# Patient Record
Sex: Female | Born: 1935 | Race: White | Hispanic: No | Smoking: Never smoker
Health system: Southern US, Community
[De-identification: ages and names within clinical notes are randomized; demographics above are authoritative.]

## PROBLEM LIST (undated history)

## (undated) DIAGNOSIS — E785 Hyperlipidemia, unspecified: Secondary | ICD-10-CM

## (undated) DIAGNOSIS — Z8673 Personal history of transient ischemic attack (TIA), and cerebral infarction without residual deficits: Secondary | ICD-10-CM

## (undated) DIAGNOSIS — I4891 Unspecified atrial fibrillation: Secondary | ICD-10-CM

## (undated) DIAGNOSIS — I739 Peripheral vascular disease, unspecified: Secondary | ICD-10-CM

## (undated) DIAGNOSIS — I639 Cerebral infarction, unspecified: Secondary | ICD-10-CM

## (undated) DIAGNOSIS — F0390 Unspecified dementia without behavioral disturbance: Secondary | ICD-10-CM

## (undated) DIAGNOSIS — S72009A Fracture of unspecified part of neck of unspecified femur, initial encounter for closed fracture: Secondary | ICD-10-CM

## (undated) DIAGNOSIS — C50919 Malignant neoplasm of unspecified site of unspecified female breast: Secondary | ICD-10-CM

## (undated) DIAGNOSIS — I10 Essential (primary) hypertension: Secondary | ICD-10-CM

## (undated) DIAGNOSIS — Z853 Personal history of malignant neoplasm of breast: Secondary | ICD-10-CM

## (undated) DIAGNOSIS — I509 Heart failure, unspecified: Secondary | ICD-10-CM

## (undated) HISTORY — DX: Essential (primary) hypertension: I10

## (undated) HISTORY — DX: Fracture of unspecified part of neck of unspecified femur, initial encounter for closed fracture: S72.009A

## (undated) HISTORY — PX: TONSILLECTOMY: SUR1361

## (undated) HISTORY — DX: Peripheral vascular disease, unspecified: I73.9

## (undated) HISTORY — PX: CYSTOSCOPY: SUR368

## (undated) HISTORY — DX: Unspecified atrial fibrillation: I48.91

## (undated) HISTORY — DX: Personal history of malignant neoplasm of breast: Z85.3

## (undated) HISTORY — PX: EYE SURGERY: SHX253

## (undated) HISTORY — DX: Hyperlipidemia, unspecified: E78.5

## (undated) HISTORY — PX: BREAST LUMPECTOMY: SHX2

## (undated) HISTORY — DX: Personal history of transient ischemic attack (TIA), and cerebral infarction without residual deficits: Z86.73

---

## 2000-04-22 ENCOUNTER — Encounter: Admission: RE | Admit: 2000-04-22 | Discharge: 2000-04-22 | Payer: Self-pay | Admitting: Family Medicine

## 2000-04-22 ENCOUNTER — Encounter: Payer: Self-pay | Admitting: Family Medicine

## 2001-05-24 ENCOUNTER — Encounter: Payer: Self-pay | Admitting: Family Medicine

## 2001-05-24 ENCOUNTER — Encounter: Admission: RE | Admit: 2001-05-24 | Discharge: 2001-05-24 | Payer: Self-pay | Admitting: Family Medicine

## 2002-06-06 ENCOUNTER — Encounter: Payer: Self-pay | Admitting: Family Medicine

## 2002-06-06 ENCOUNTER — Encounter: Admission: RE | Admit: 2002-06-06 | Discharge: 2002-06-06 | Payer: Self-pay | Admitting: Family Medicine

## 2003-06-08 ENCOUNTER — Encounter: Payer: Self-pay | Admitting: Family Medicine

## 2003-06-08 ENCOUNTER — Encounter: Admission: RE | Admit: 2003-06-08 | Discharge: 2003-06-08 | Payer: Self-pay | Admitting: Family Medicine

## 2003-09-05 ENCOUNTER — Other Ambulatory Visit: Admission: RE | Admit: 2003-09-05 | Discharge: 2003-09-05 | Payer: Self-pay | Admitting: Obstetrics and Gynecology

## 2004-07-10 ENCOUNTER — Encounter: Admission: RE | Admit: 2004-07-10 | Discharge: 2004-07-10 | Payer: Self-pay | Admitting: Family Medicine

## 2005-09-10 ENCOUNTER — Other Ambulatory Visit: Admission: RE | Admit: 2005-09-10 | Discharge: 2005-09-10 | Payer: Self-pay | Admitting: Obstetrics and Gynecology

## 2005-09-16 ENCOUNTER — Encounter: Admission: RE | Admit: 2005-09-16 | Discharge: 2005-09-16 | Payer: Self-pay | Admitting: Obstetrics and Gynecology

## 2006-04-28 ENCOUNTER — Encounter: Admission: RE | Admit: 2006-04-28 | Discharge: 2006-04-28 | Payer: Self-pay | Admitting: Surgery

## 2006-05-03 ENCOUNTER — Encounter (INDEPENDENT_AMBULATORY_CARE_PROVIDER_SITE_OTHER): Payer: Self-pay | Admitting: Surgery

## 2006-05-03 ENCOUNTER — Ambulatory Visit (HOSPITAL_BASED_OUTPATIENT_CLINIC_OR_DEPARTMENT_OTHER): Admission: RE | Admit: 2006-05-03 | Discharge: 2006-05-03 | Payer: Self-pay | Admitting: Surgery

## 2006-05-03 ENCOUNTER — Encounter (INDEPENDENT_AMBULATORY_CARE_PROVIDER_SITE_OTHER): Payer: Self-pay | Admitting: Specialist

## 2006-05-17 ENCOUNTER — Ambulatory Visit (HOSPITAL_COMMUNITY): Admission: RE | Admit: 2006-05-17 | Discharge: 2006-05-17 | Payer: Self-pay | Admitting: Surgery

## 2006-06-02 ENCOUNTER — Encounter (INDEPENDENT_AMBULATORY_CARE_PROVIDER_SITE_OTHER): Payer: Self-pay | Admitting: Surgery

## 2006-06-02 ENCOUNTER — Ambulatory Visit: Payer: Self-pay | Admitting: Oncology

## 2006-06-02 ENCOUNTER — Ambulatory Visit (HOSPITAL_BASED_OUTPATIENT_CLINIC_OR_DEPARTMENT_OTHER): Admission: RE | Admit: 2006-06-02 | Discharge: 2006-06-02 | Payer: Self-pay | Admitting: Surgery

## 2006-06-02 ENCOUNTER — Encounter (INDEPENDENT_AMBULATORY_CARE_PROVIDER_SITE_OTHER): Payer: Self-pay | Admitting: Specialist

## 2006-06-15 ENCOUNTER — Ambulatory Visit: Admission: RE | Admit: 2006-06-15 | Discharge: 2006-08-16 | Payer: Self-pay | Admitting: Radiation Oncology

## 2006-06-30 LAB — CBC WITH DIFFERENTIAL/PLATELET
Basophils Absolute: 0 10*3/uL (ref 0.0–0.1)
EOS%: 0.5 % (ref 0.0–7.0)
HCT: 35.1 % (ref 34.8–46.6)
HGB: 12 g/dL (ref 11.6–15.9)
MCH: 31 pg (ref 26.0–34.0)
MONO#: 0.6 10*3/uL (ref 0.1–0.9)
NEUT%: 65.2 % (ref 39.6–76.8)
lymph#: 1.1 10*3/uL (ref 0.9–3.3)

## 2006-06-30 LAB — COMPREHENSIVE METABOLIC PANEL
BUN: 24 mg/dL — ABNORMAL HIGH (ref 6–23)
CO2: 30 mEq/L (ref 19–32)
Calcium: 9.3 mg/dL (ref 8.4–10.5)
Chloride: 104 mEq/L (ref 96–112)
Creatinine, Ser: 1.03 mg/dL (ref 0.40–1.20)

## 2006-06-30 LAB — CANCER ANTIGEN 27.29: CA 27.29: 36 U/mL (ref 0–39)

## 2006-06-30 LAB — LACTATE DEHYDROGENASE: LDH: 150 U/L (ref 94–250)

## 2006-11-23 DIAGNOSIS — Z8673 Personal history of transient ischemic attack (TIA), and cerebral infarction without residual deficits: Secondary | ICD-10-CM

## 2006-11-23 DIAGNOSIS — I639 Cerebral infarction, unspecified: Secondary | ICD-10-CM

## 2006-11-23 HISTORY — DX: Personal history of transient ischemic attack (TIA), and cerebral infarction without residual deficits: Z86.73

## 2006-11-23 HISTORY — DX: Cerebral infarction, unspecified: I63.9

## 2007-01-28 ENCOUNTER — Encounter: Admission: RE | Admit: 2007-01-28 | Discharge: 2007-01-28 | Payer: Self-pay | Admitting: Obstetrics and Gynecology

## 2007-07-12 ENCOUNTER — Ambulatory Visit: Payer: Self-pay | Admitting: Oncology

## 2007-07-13 LAB — COMPREHENSIVE METABOLIC PANEL
ALT: 9 U/L (ref 0–35)
Albumin: 4.1 g/dL (ref 3.5–5.2)
Alkaline Phosphatase: 52 U/L (ref 39–117)
CO2: 30 mEq/L (ref 19–32)
Glucose, Bld: 128 mg/dL — ABNORMAL HIGH (ref 70–99)
Potassium: 4.1 mEq/L (ref 3.5–5.3)
Sodium: 141 mEq/L (ref 135–145)
Total Protein: 6.5 g/dL (ref 6.0–8.3)

## 2007-07-13 LAB — CBC WITH DIFFERENTIAL/PLATELET
Eosinophils Absolute: 0 10*3/uL (ref 0.0–0.5)
MONO#: 0.4 10*3/uL (ref 0.1–0.9)
NEUT#: 3.4 10*3/uL (ref 1.5–6.5)
RBC: 3.67 10*6/uL — ABNORMAL LOW (ref 3.70–5.32)
RDW: 13.1 % (ref 11.3–14.5)
WBC: 4.8 10*3/uL (ref 3.9–10.0)

## 2007-08-26 ENCOUNTER — Emergency Department (HOSPITAL_COMMUNITY): Admission: EM | Admit: 2007-08-26 | Discharge: 2007-08-26 | Payer: Self-pay | Admitting: *Deleted

## 2007-09-06 ENCOUNTER — Ambulatory Visit: Payer: Self-pay | Admitting: Cardiology

## 2007-09-13 ENCOUNTER — Ambulatory Visit: Payer: Self-pay

## 2007-10-05 ENCOUNTER — Ambulatory Visit: Payer: Self-pay | Admitting: Cardiology

## 2007-10-10 ENCOUNTER — Ambulatory Visit: Payer: Self-pay | Admitting: Cardiology

## 2007-11-07 ENCOUNTER — Ambulatory Visit: Payer: Self-pay | Admitting: Internal Medicine

## 2007-11-09 ENCOUNTER — Encounter: Payer: Self-pay | Admitting: Internal Medicine

## 2007-11-10 ENCOUNTER — Inpatient Hospital Stay (HOSPITAL_COMMUNITY): Admission: EM | Admit: 2007-11-10 | Discharge: 2007-11-10 | Payer: Self-pay | Admitting: Emergency Medicine

## 2007-11-10 ENCOUNTER — Encounter: Payer: Self-pay | Admitting: Family Medicine

## 2007-11-10 ENCOUNTER — Ambulatory Visit: Payer: Self-pay | Admitting: Surgery

## 2007-11-10 ENCOUNTER — Ambulatory Visit: Payer: Self-pay | Admitting: Family Medicine

## 2007-11-24 DIAGNOSIS — S72009A Fracture of unspecified part of neck of unspecified femur, initial encounter for closed fracture: Secondary | ICD-10-CM

## 2007-11-24 HISTORY — PX: OTHER SURGICAL HISTORY: SHX169

## 2007-11-24 HISTORY — DX: Fracture of unspecified part of neck of unspecified femur, initial encounter for closed fracture: S72.009A

## 2007-11-28 ENCOUNTER — Telehealth (INDEPENDENT_AMBULATORY_CARE_PROVIDER_SITE_OTHER): Payer: Self-pay | Admitting: *Deleted

## 2007-11-28 ENCOUNTER — Ambulatory Visit: Payer: Self-pay | Admitting: Cardiology

## 2007-11-30 ENCOUNTER — Ambulatory Visit: Payer: Self-pay

## 2007-11-30 ENCOUNTER — Ambulatory Visit: Payer: Self-pay | Admitting: Cardiovascular Disease

## 2007-12-05 ENCOUNTER — Ambulatory Visit: Payer: Self-pay | Admitting: Internal Medicine

## 2007-12-05 DIAGNOSIS — I10 Essential (primary) hypertension: Secondary | ICD-10-CM | POA: Insufficient documentation

## 2007-12-05 DIAGNOSIS — Z853 Personal history of malignant neoplasm of breast: Secondary | ICD-10-CM

## 2007-12-05 DIAGNOSIS — I739 Peripheral vascular disease, unspecified: Secondary | ICD-10-CM

## 2007-12-05 DIAGNOSIS — M810 Age-related osteoporosis without current pathological fracture: Secondary | ICD-10-CM | POA: Insufficient documentation

## 2007-12-05 DIAGNOSIS — R300 Dysuria: Secondary | ICD-10-CM

## 2007-12-05 DIAGNOSIS — Z8679 Personal history of other diseases of the circulatory system: Secondary | ICD-10-CM | POA: Insufficient documentation

## 2007-12-05 DIAGNOSIS — R32 Unspecified urinary incontinence: Secondary | ICD-10-CM

## 2007-12-05 DIAGNOSIS — E785 Hyperlipidemia, unspecified: Secondary | ICD-10-CM

## 2007-12-05 LAB — CONVERTED CEMR LAB

## 2007-12-06 LAB — CONVERTED CEMR LAB
Bilirubin Urine: NEGATIVE
Crystals: NEGATIVE
Hemoglobin, Urine: NEGATIVE
Ketones, ur: NEGATIVE mg/dL
Nitrite: NEGATIVE
Urine Glucose: NEGATIVE mg/dL

## 2007-12-21 ENCOUNTER — Ambulatory Visit: Payer: Self-pay | Admitting: Internal Medicine

## 2007-12-21 DIAGNOSIS — I6350 Cerebral infarction due to unspecified occlusion or stenosis of unspecified cerebral artery: Secondary | ICD-10-CM | POA: Insufficient documentation

## 2007-12-21 LAB — CONVERTED CEMR LAB
ALT: 16 units/L (ref 0–35)
AST: 28 units/L (ref 0–37)
Albumin: 3.6 g/dL (ref 3.5–5.2)
BUN: 21 mg/dL (ref 6–23)
CO2: 33 meq/L — ABNORMAL HIGH (ref 19–32)
Calcium: 9.2 mg/dL (ref 8.4–10.5)
Cholesterol: 195 mg/dL (ref 0–200)
Creatinine, Ser: 1.1 mg/dL (ref 0.4–1.2)
LDL Cholesterol: 117 mg/dL — ABNORMAL HIGH (ref 0–99)
TSH: 3.31 microintl units/mL (ref 0.35–5.50)
Total CHOL/HDL Ratio: 3.1
VLDL: 16 mg/dL (ref 0–40)

## 2007-12-22 ENCOUNTER — Telehealth: Payer: Self-pay | Admitting: Internal Medicine

## 2007-12-23 ENCOUNTER — Ambulatory Visit: Payer: Self-pay | Admitting: Internal Medicine

## 2007-12-27 ENCOUNTER — Encounter: Payer: Self-pay | Admitting: Internal Medicine

## 2007-12-27 LAB — CONVERTED CEMR LAB
Bacteria, UA: NEGATIVE
Bilirubin Urine: NEGATIVE
Crystals: NEGATIVE
Ketones, ur: NEGATIVE mg/dL
Mucus, UA: NEGATIVE
Nitrite: NEGATIVE
Urine Glucose: NEGATIVE mg/dL

## 2008-01-02 ENCOUNTER — Ambulatory Visit: Payer: Self-pay | Admitting: Cardiology

## 2008-01-12 ENCOUNTER — Ambulatory Visit: Payer: Self-pay | Admitting: Cardiovascular Disease

## 2008-01-20 ENCOUNTER — Ambulatory Visit: Payer: Self-pay | Admitting: Internal Medicine

## 2008-01-24 ENCOUNTER — Encounter: Payer: Self-pay | Admitting: Internal Medicine

## 2008-01-24 ENCOUNTER — Telehealth: Payer: Self-pay | Admitting: Internal Medicine

## 2008-01-26 ENCOUNTER — Ambulatory Visit: Payer: Self-pay | Admitting: Internal Medicine

## 2008-01-26 DIAGNOSIS — R609 Edema, unspecified: Secondary | ICD-10-CM

## 2008-02-07 ENCOUNTER — Telehealth: Payer: Self-pay | Admitting: Internal Medicine

## 2008-02-20 ENCOUNTER — Telehealth: Payer: Self-pay | Admitting: Internal Medicine

## 2008-02-20 ENCOUNTER — Ambulatory Visit: Payer: Self-pay | Admitting: Internal Medicine

## 2008-02-20 LAB — CONVERTED CEMR LAB
BUN: 26 mg/dL — ABNORMAL HIGH (ref 6–23)
CO2: 33 meq/L — ABNORMAL HIGH (ref 19–32)
Calcium: 9.1 mg/dL (ref 8.4–10.5)
Creatinine, Ser: 1.1 mg/dL (ref 0.4–1.2)
GFR calc Af Amer: 61 mL/min
Glucose, Bld: 115 mg/dL — ABNORMAL HIGH (ref 70–99)
HDL: 55.4 mg/dL (ref >39.0)
Sodium: 143 meq/L (ref 135–145)
VLDL: 10 mg/dL (ref 0–40)

## 2008-02-23 ENCOUNTER — Encounter: Admission: RE | Admit: 2008-02-23 | Discharge: 2008-02-23 | Payer: Self-pay | Admitting: Internal Medicine

## 2008-02-24 ENCOUNTER — Encounter: Payer: Self-pay | Admitting: Internal Medicine

## 2008-02-27 ENCOUNTER — Ambulatory Visit: Payer: Self-pay | Admitting: Internal Medicine

## 2008-02-27 DIAGNOSIS — R7309 Other abnormal glucose: Secondary | ICD-10-CM | POA: Insufficient documentation

## 2008-02-28 ENCOUNTER — Telehealth (INDEPENDENT_AMBULATORY_CARE_PROVIDER_SITE_OTHER): Payer: Self-pay | Admitting: *Deleted

## 2008-02-29 ENCOUNTER — Encounter: Admission: RE | Admit: 2008-02-29 | Discharge: 2008-02-29 | Payer: Self-pay | Admitting: Oncology

## 2008-03-07 ENCOUNTER — Ambulatory Visit: Payer: Self-pay | Admitting: Cardiology

## 2008-03-15 ENCOUNTER — Encounter: Payer: Self-pay | Admitting: Internal Medicine

## 2008-05-08 ENCOUNTER — Encounter: Payer: Self-pay | Admitting: Internal Medicine

## 2008-05-15 ENCOUNTER — Telehealth: Payer: Self-pay | Admitting: Internal Medicine

## 2008-05-21 ENCOUNTER — Encounter: Payer: Self-pay | Admitting: Internal Medicine

## 2008-06-04 ENCOUNTER — Ambulatory Visit: Payer: Self-pay | Admitting: Internal Medicine

## 2008-06-06 ENCOUNTER — Ambulatory Visit: Payer: Self-pay | Admitting: Internal Medicine

## 2008-07-04 ENCOUNTER — Encounter: Payer: Self-pay | Admitting: Internal Medicine

## 2008-07-05 ENCOUNTER — Ambulatory Visit: Payer: Self-pay | Admitting: Internal Medicine

## 2008-07-05 DIAGNOSIS — R1310 Dysphagia, unspecified: Secondary | ICD-10-CM | POA: Insufficient documentation

## 2008-07-05 LAB — CONVERTED CEMR LAB
BUN: 23 mg/dL (ref 6–23)
CO2: 33 meq/L — ABNORMAL HIGH (ref 19–32)
Calcium: 9 mg/dL (ref 8.4–10.5)
Chloride: 104 meq/L (ref 96–112)
Creatinine, Ser: 1.1 mg/dL (ref 0.4–1.2)
Glucose, Bld: 117 mg/dL — ABNORMAL HIGH (ref 70–99)
Potassium: 4.4 meq/L (ref 3.5–5.1)
Sodium: 143 meq/L (ref 135–145)

## 2008-07-06 ENCOUNTER — Ambulatory Visit: Payer: Self-pay | Admitting: Cardiovascular Disease

## 2008-07-06 ENCOUNTER — Encounter: Payer: Self-pay | Admitting: Internal Medicine

## 2008-07-10 ENCOUNTER — Telehealth: Payer: Self-pay | Admitting: Internal Medicine

## 2008-07-17 ENCOUNTER — Ambulatory Visit: Payer: Self-pay | Admitting: Oncology

## 2008-07-19 ENCOUNTER — Ambulatory Visit: Payer: Self-pay | Admitting: Internal Medicine

## 2008-07-19 DIAGNOSIS — R4182 Altered mental status, unspecified: Secondary | ICD-10-CM | POA: Insufficient documentation

## 2008-07-19 LAB — COMPREHENSIVE METABOLIC PANEL WITH GFR
ALT: 19 U/L (ref 0–35)
AST: 31 U/L (ref 0–37)
Albumin: 3.6 g/dL (ref 3.5–5.2)
Alkaline Phosphatase: 29 U/L — ABNORMAL LOW (ref 39–117)
BUN: 16 mg/dL (ref 6–23)
CO2: 29 meq/L (ref 19–32)
Calcium: 8.9 mg/dL (ref 8.4–10.5)
Chloride: 105 meq/L (ref 96–112)
Creatinine, Ser: 1.16 mg/dL (ref 0.40–1.20)
Glucose, Bld: 110 mg/dL — ABNORMAL HIGH (ref 70–99)
Potassium: 3.8 meq/L (ref 3.5–5.3)
Sodium: 141 meq/L (ref 135–145)
Total Bilirubin: 0.7 mg/dL (ref 0.3–1.2)
Total Protein: 6.3 g/dL (ref 6.0–8.3)

## 2008-07-19 LAB — CBC WITH DIFFERENTIAL/PLATELET
Basophils Absolute: 0 10*3/uL (ref 0.0–0.1)
EOS%: 1.5 % (ref 0.0–7.0)
HCT: 35.1 % (ref 34.8–46.6)
HGB: 12 g/dL (ref 11.6–15.9)
LYMPH%: 24.3 % (ref 14.0–48.0)
MCH: 31.6 pg (ref 26.0–34.0)
MCV: 92.1 fL (ref 81.0–101.0)
MONO%: 10.5 % (ref 0.0–13.0)
NEUT%: 63.2 % (ref 39.6–76.8)
Platelets: 187 10*3/uL (ref 145–400)

## 2008-07-24 HISTORY — PX: OTHER SURGICAL HISTORY: SHX169

## 2008-07-31 ENCOUNTER — Encounter: Payer: Self-pay | Admitting: Internal Medicine

## 2008-08-13 ENCOUNTER — Ambulatory Visit: Payer: Self-pay | Admitting: Internal Medicine

## 2008-08-13 ENCOUNTER — Telehealth: Payer: Self-pay | Admitting: Internal Medicine

## 2008-08-13 ENCOUNTER — Inpatient Hospital Stay (HOSPITAL_COMMUNITY): Admission: AD | Admit: 2008-08-13 | Discharge: 2008-08-17 | Payer: Self-pay | Admitting: Internal Medicine

## 2008-08-13 DIAGNOSIS — S7223XA Displaced subtrochanteric fracture of unspecified femur, initial encounter for closed fracture: Secondary | ICD-10-CM

## 2008-08-31 ENCOUNTER — Ambulatory Visit: Payer: Self-pay | Admitting: Internal Medicine

## 2008-10-03 ENCOUNTER — Encounter: Payer: Self-pay | Admitting: Internal Medicine

## 2008-10-08 ENCOUNTER — Ambulatory Visit: Payer: Self-pay | Admitting: Internal Medicine

## 2008-10-22 ENCOUNTER — Telehealth: Payer: Self-pay | Admitting: Internal Medicine

## 2008-11-01 ENCOUNTER — Ambulatory Visit: Payer: Self-pay | Admitting: Cardiology

## 2008-11-15 ENCOUNTER — Ambulatory Visit: Payer: Self-pay | Admitting: Cardiology

## 2008-11-15 LAB — CONVERTED CEMR LAB
BUN: 22 mg/dL (ref 6–23)
CO2: 31 meq/L (ref 19–32)
Calcium: 8.9 mg/dL (ref 8.4–10.5)
Creatinine, Ser: 1 mg/dL (ref 0.4–1.2)
GFR calc non Af Amer: 56 mL/min
Sodium: 140 meq/L (ref 135–145)

## 2008-12-12 ENCOUNTER — Ambulatory Visit: Payer: Self-pay | Admitting: Internal Medicine

## 2008-12-12 LAB — CONVERTED CEMR LAB
BUN: 18 mg/dL (ref 6–23)
CO2: 33 meq/L — ABNORMAL HIGH (ref 19–32)
Chloride: 107 meq/L (ref 96–112)
Cholesterol: 231 mg/dL (ref 0–200)
Creatinine, Ser: 1 mg/dL (ref 0.4–1.2)
Direct LDL: 151.4 mg/dL
Potassium: 4.7 meq/L (ref 3.5–5.1)
Triglycerides: 71 mg/dL (ref 0–149)

## 2009-01-28 ENCOUNTER — Encounter: Payer: Self-pay | Admitting: Cardiology

## 2009-01-28 ENCOUNTER — Ambulatory Visit: Payer: Self-pay | Admitting: Cardiology

## 2009-05-14 ENCOUNTER — Encounter: Admission: RE | Admit: 2009-05-14 | Discharge: 2009-05-14 | Payer: Self-pay | Admitting: Cardiology

## 2009-05-14 LAB — HM MAMMOGRAPHY

## 2009-05-18 ENCOUNTER — Emergency Department (HOSPITAL_COMMUNITY): Admission: EM | Admit: 2009-05-18 | Discharge: 2009-05-18 | Payer: Self-pay | Admitting: Emergency Medicine

## 2009-05-28 ENCOUNTER — Ambulatory Visit (HOSPITAL_BASED_OUTPATIENT_CLINIC_OR_DEPARTMENT_OTHER): Admission: RE | Admit: 2009-05-28 | Discharge: 2009-05-28 | Payer: Self-pay | Admitting: Urology

## 2009-06-01 ENCOUNTER — Telehealth (INDEPENDENT_AMBULATORY_CARE_PROVIDER_SITE_OTHER): Payer: Self-pay | Admitting: Physician Assistant

## 2009-06-03 ENCOUNTER — Ambulatory Visit: Payer: Self-pay | Admitting: Cardiology

## 2009-06-03 DIAGNOSIS — I4891 Unspecified atrial fibrillation: Secondary | ICD-10-CM

## 2009-06-06 ENCOUNTER — Ambulatory Visit: Payer: Self-pay | Admitting: Cardiology

## 2009-06-06 LAB — CONVERTED CEMR LAB: POC INR: 1.1

## 2009-06-13 ENCOUNTER — Ambulatory Visit: Payer: Self-pay | Admitting: Cardiovascular Disease

## 2009-06-13 LAB — CONVERTED CEMR LAB
POC INR: 1.9
Prothrombin Time: 16.8 s

## 2009-06-20 ENCOUNTER — Ambulatory Visit: Payer: Self-pay | Admitting: Cardiovascular Disease

## 2009-06-20 ENCOUNTER — Telehealth: Payer: Self-pay | Admitting: Cardiology

## 2009-06-20 LAB — CONVERTED CEMR LAB
POC INR: 3
Prothrombin Time: 20.8 s

## 2009-06-27 ENCOUNTER — Ambulatory Visit: Payer: Self-pay | Admitting: Cardiology

## 2009-06-27 LAB — CONVERTED CEMR LAB: POC INR: 1.5

## 2009-07-04 ENCOUNTER — Ambulatory Visit: Payer: Self-pay | Admitting: Internal Medicine

## 2009-07-04 LAB — CONVERTED CEMR LAB: POC INR: 2.3

## 2009-07-09 ENCOUNTER — Ambulatory Visit: Payer: Self-pay | Admitting: Internal Medicine

## 2009-07-11 ENCOUNTER — Ambulatory Visit: Payer: Self-pay | Admitting: Cardiology

## 2009-07-11 ENCOUNTER — Encounter (INDEPENDENT_AMBULATORY_CARE_PROVIDER_SITE_OTHER): Payer: Self-pay | Admitting: Cardiology

## 2009-07-11 LAB — CONVERTED CEMR LAB: POC INR: 2.3

## 2009-07-19 ENCOUNTER — Ambulatory Visit: Payer: Self-pay | Admitting: Cardiovascular Disease

## 2009-08-16 ENCOUNTER — Ambulatory Visit: Payer: Self-pay | Admitting: Internal Medicine

## 2009-08-16 LAB — CONVERTED CEMR LAB
Basophils Absolute: 0 10*3/uL (ref 0.0–0.1)
CO2: 25 meq/L (ref 19–32)
Chloride: 106 meq/L (ref 96–112)
Eosinophils Relative: 1.6 % (ref 0.0–5.0)
GFR calc non Af Amer: 55.66 mL/min (ref >60)
Monocytes Absolute: 0.7 10*3/uL (ref 0.1–1.0)
Monocytes Relative: 13.6 % — ABNORMAL HIGH (ref 3.0–12.0)
Neutrophils Relative %: 63 % (ref 43.0–77.0)
Phosphorus: 3.6 mg/dL (ref 2.3–4.6)
Platelets: 161 10*3/uL (ref 150.0–400.0)
Potassium: 4.4 meq/L (ref 3.5–5.1)
RDW: 12.3 % (ref 11.5–14.6)
Sodium: 136 meq/L (ref 135–145)
TSH: 2.22 microintl units/mL (ref 0.35–5.50)
Vitamin B-12: 557 pg/mL (ref 211–911)
WBC: 5.5 10*3/uL (ref 4.5–10.5)

## 2009-08-21 ENCOUNTER — Telehealth: Payer: Self-pay | Admitting: Internal Medicine

## 2009-08-21 ENCOUNTER — Ambulatory Visit: Payer: Self-pay | Admitting: Cardiovascular Disease

## 2009-08-21 LAB — CONVERTED CEMR LAB: POC INR: 3

## 2009-08-22 ENCOUNTER — Ambulatory Visit: Payer: Self-pay | Admitting: Internal Medicine

## 2009-09-05 ENCOUNTER — Ambulatory Visit: Payer: Self-pay | Admitting: Cardiology

## 2009-09-13 ENCOUNTER — Ambulatory Visit: Payer: Self-pay | Admitting: Oncology

## 2009-09-19 ENCOUNTER — Ambulatory Visit: Payer: Self-pay | Admitting: Cardiology

## 2009-09-19 LAB — CONVERTED CEMR LAB: POC INR: 2.7

## 2009-10-14 ENCOUNTER — Ambulatory Visit: Payer: Self-pay | Admitting: Internal Medicine

## 2009-11-11 ENCOUNTER — Ambulatory Visit: Payer: Self-pay | Admitting: Cardiology

## 2009-11-11 ENCOUNTER — Telehealth: Payer: Self-pay | Admitting: Internal Medicine

## 2009-11-11 LAB — CONVERTED CEMR LAB: POC INR: 3.5

## 2009-11-13 ENCOUNTER — Ambulatory Visit: Payer: Self-pay | Admitting: Internal Medicine

## 2009-11-13 DIAGNOSIS — R0989 Other specified symptoms and signs involving the circulatory and respiratory systems: Secondary | ICD-10-CM

## 2009-11-13 DIAGNOSIS — R0609 Other forms of dyspnea: Secondary | ICD-10-CM

## 2009-12-02 ENCOUNTER — Encounter: Payer: Self-pay | Admitting: Internal Medicine

## 2009-12-02 ENCOUNTER — Ambulatory Visit: Payer: Self-pay

## 2009-12-02 ENCOUNTER — Ambulatory Visit: Payer: Self-pay | Admitting: Cardiology

## 2009-12-02 ENCOUNTER — Ambulatory Visit (HOSPITAL_COMMUNITY): Admission: RE | Admit: 2009-12-02 | Discharge: 2009-12-02 | Payer: Self-pay | Admitting: Internal Medicine

## 2009-12-09 ENCOUNTER — Ambulatory Visit: Payer: Self-pay | Admitting: Cardiology

## 2009-12-09 LAB — CONVERTED CEMR LAB: POC INR: 2

## 2009-12-13 ENCOUNTER — Telehealth (INDEPENDENT_AMBULATORY_CARE_PROVIDER_SITE_OTHER): Payer: Self-pay | Admitting: *Deleted

## 2009-12-13 ENCOUNTER — Ambulatory Visit: Payer: Self-pay | Admitting: Internal Medicine

## 2009-12-13 DIAGNOSIS — R269 Unspecified abnormalities of gait and mobility: Secondary | ICD-10-CM | POA: Insufficient documentation

## 2009-12-14 ENCOUNTER — Encounter: Payer: Self-pay | Admitting: Internal Medicine

## 2009-12-16 ENCOUNTER — Telehealth: Payer: Self-pay | Admitting: Internal Medicine

## 2009-12-19 ENCOUNTER — Telehealth: Payer: Self-pay | Admitting: Internal Medicine

## 2009-12-24 ENCOUNTER — Encounter: Payer: Self-pay | Admitting: Internal Medicine

## 2009-12-24 ENCOUNTER — Encounter (INDEPENDENT_AMBULATORY_CARE_PROVIDER_SITE_OTHER): Payer: Self-pay | Admitting: *Deleted

## 2009-12-27 ENCOUNTER — Ambulatory Visit: Payer: Self-pay | Admitting: Oncology

## 2009-12-31 ENCOUNTER — Encounter: Payer: Self-pay | Admitting: Internal Medicine

## 2009-12-31 LAB — CBC WITH DIFFERENTIAL/PLATELET
BASO%: 0.3 % (ref 0.0–2.0)
Eosinophils Absolute: 0.1 10*3/uL (ref 0.0–0.5)
MCHC: 33.5 g/dL (ref 31.5–36.0)
MONO#: 0.6 10*3/uL (ref 0.1–0.9)
NEUT#: 4.9 10*3/uL (ref 1.5–6.5)
RBC: 4.26 10*6/uL (ref 3.70–5.45)
RDW: 13.6 % (ref 11.2–14.5)
WBC: 6.5 10*3/uL (ref 3.9–10.3)
lymph#: 0.9 10*3/uL (ref 0.9–3.3)

## 2010-01-06 ENCOUNTER — Encounter (INDEPENDENT_AMBULATORY_CARE_PROVIDER_SITE_OTHER): Payer: Self-pay | Admitting: Cardiology

## 2010-01-06 ENCOUNTER — Ambulatory Visit: Payer: Self-pay | Admitting: Cardiology

## 2010-01-16 ENCOUNTER — Ambulatory Visit: Payer: Self-pay | Admitting: Internal Medicine

## 2010-01-23 ENCOUNTER — Encounter: Payer: Self-pay | Admitting: Internal Medicine

## 2010-01-27 ENCOUNTER — Encounter: Admission: RE | Admit: 2010-01-27 | Discharge: 2010-01-27 | Payer: Self-pay | Admitting: Neurology

## 2010-02-03 ENCOUNTER — Ambulatory Visit: Payer: Self-pay | Admitting: Internal Medicine

## 2010-02-12 ENCOUNTER — Telehealth: Payer: Self-pay | Admitting: Internal Medicine

## 2010-02-17 ENCOUNTER — Ambulatory Visit: Payer: Self-pay | Admitting: Internal Medicine

## 2010-03-03 ENCOUNTER — Ambulatory Visit: Payer: Self-pay | Admitting: Cardiovascular Disease

## 2010-03-03 LAB — CONVERTED CEMR LAB: POC INR: 2

## 2010-03-31 ENCOUNTER — Ambulatory Visit: Payer: Self-pay | Admitting: Cardiology

## 2010-03-31 LAB — CONVERTED CEMR LAB: POC INR: 2.1

## 2010-04-23 ENCOUNTER — Ambulatory Visit: Payer: Self-pay | Admitting: Oncology

## 2010-04-28 ENCOUNTER — Ambulatory Visit: Payer: Self-pay | Admitting: Cardiology

## 2010-05-19 ENCOUNTER — Encounter: Admission: RE | Admit: 2010-05-19 | Discharge: 2010-05-19 | Payer: Self-pay | Admitting: Oncology

## 2010-05-27 ENCOUNTER — Ambulatory Visit: Payer: Self-pay | Admitting: Internal Medicine

## 2010-05-27 LAB — CONVERTED CEMR LAB: POC INR: 2.4

## 2010-06-04 ENCOUNTER — Encounter: Payer: Self-pay | Admitting: Cardiology

## 2010-06-16 ENCOUNTER — Encounter: Admission: RE | Admit: 2010-06-16 | Discharge: 2010-06-16 | Payer: Self-pay | Admitting: Oncology

## 2010-06-19 ENCOUNTER — Ambulatory Visit (HOSPITAL_BASED_OUTPATIENT_CLINIC_OR_DEPARTMENT_OTHER): Admission: RE | Admit: 2010-06-19 | Discharge: 2010-06-19 | Payer: Self-pay | Admitting: Urology

## 2010-07-22 ENCOUNTER — Ambulatory Visit: Payer: Self-pay | Admitting: Cardiovascular Disease

## 2010-07-22 LAB — CONVERTED CEMR LAB: POC INR: 2.6

## 2010-08-19 ENCOUNTER — Ambulatory Visit: Payer: Self-pay | Admitting: Cardiovascular Disease

## 2010-08-21 ENCOUNTER — Ambulatory Visit: Payer: Self-pay | Admitting: Internal Medicine

## 2010-09-16 ENCOUNTER — Ambulatory Visit: Payer: Self-pay | Admitting: Cardiology

## 2010-09-16 LAB — CONVERTED CEMR LAB: POC INR: 2.6

## 2010-09-18 ENCOUNTER — Encounter: Payer: Self-pay | Admitting: Cardiology

## 2010-09-19 ENCOUNTER — Ambulatory Visit: Payer: Self-pay | Admitting: Cardiology

## 2010-10-14 ENCOUNTER — Ambulatory Visit: Payer: Self-pay | Admitting: Cardiovascular Disease

## 2010-11-05 ENCOUNTER — Ambulatory Visit: Payer: Self-pay | Admitting: Cardiology

## 2010-11-05 LAB — CONVERTED CEMR LAB: POC INR: 3.6

## 2010-11-19 ENCOUNTER — Ambulatory Visit: Payer: Self-pay | Admitting: Cardiology

## 2010-12-03 ENCOUNTER — Ambulatory Visit: Admission: RE | Admit: 2010-12-03 | Discharge: 2010-12-03 | Payer: Self-pay | Source: Home / Self Care

## 2010-12-03 LAB — CONVERTED CEMR LAB: POC INR: 3.4

## 2010-12-14 ENCOUNTER — Encounter: Payer: Self-pay | Admitting: Obstetrics and Gynecology

## 2010-12-14 ENCOUNTER — Encounter: Payer: Self-pay | Admitting: Oncology

## 2010-12-17 ENCOUNTER — Ambulatory Visit: Admit: 2010-12-17 | Discharge: 2010-12-17 | Payer: Self-pay

## 2010-12-17 LAB — CONVERTED CEMR LAB
INR: 2.9
POC INR: 2.9

## 2010-12-23 NOTE — Assessment & Plan Note (Signed)
Summary: f/u appt (appt made by grandaughter) flu shot/cd   Vital Signs:  Patient profile:   77 year old female Height:      60 inches Weight:      117 pounds BMI:     22.93 O2 Sat:      88 % on Room air Temp:     98.0 degrees F oral Pulse rate:   95 / minute BP sitting:   100 / 64  (left arm) Cuff size:   regular  O2 Flow:  Room air CC: pt here for f/u. /cp sma   Primary Care Provider:  Jacques Navy MD  CC:  pt here for f/u. /cp sma.  History of Present Illness: Swelling of the left foot and ankle. Progressive during the day with some resolution over night. Elevating legs during the day. Not on any meds.   Still having a lot of issue of confusion per grand-daughter. Seems to wax and wane. Already on medication.  No cardiac symptoms. Good appetite. Not fearful or agitated.   Current Medications (verified): 1)  Fosamax 70 Mg  Tabs (Alendronate Sodium) .... Take One Tablet By Mouth Weekly 2)  Fish Oil 1000 Mg Caps (Omega-3 Fatty Acids) .... Once Daily 3)  Dulcolax Stool Softener 100 Mg Caps (Docusate Sodium) .... As Needed 4)  Vitamin C 500 Mg Tabs (Ascorbic Acid) .... Take 1 Tablet By Mouth Once A Day 5)  Glucosamine-Chondroitin-Msm 500-250-250 Mg Caps (Glucosamine-Chondroitin-Msm) .... Take 1 Tablet By Mouth Once A Day 6)  Tylenol Extra Strength 500 Mg Tabs (Acetaminophen) .... As Needed 7)  Centrum  Tabs (Multiple Vitamins-Minerals) .... Take 1 Tablet By Mouth Once A Day 8)  Metoprolol Tartrate 25 Mg Tabs (Metoprolol Tartrate) .Marland Kitchen.. 1 Tab As Directed By Physcian Twice A Day 9)  Warfarin Sodium 5 Mg Tabs (Warfarin Sodium) .... One By Mouth Daily or As Directed 10)  Caltrate 600+d 600-400 Mg-Unit Tabs (Calcium Carbonate-Vitamin D) .Marland Kitchen.. 1 Tablet Every Evening 11)  Namenda 10 Mg Tabs (Memantine Hcl) .Marland Kitchen.. 1 By Mouth Two Times A Day  Allergies (verified): No Known Drug Allergies  Past History:  Past Medical History: Last updated: 06/03/2009 Breast cancer, hx of-left,  s/p lumpectomy '08, also XRT Hyperlipidemia Hypertension Osteoporosis Cerebrovascular accident, hx of '08 Urinary incontinence- irritable bladder Peripheral vascular disease (most recent ABI 0.79 on the right and 0.53 on the left).  Fracture left hip Sept '09 Atrial fibrillation  Past Surgical History: Last updated: 06/03/2009 Tonsillectomy Left breast lumpectomy ORIF left hip Sept '09 G1P1 Cystoscopy with Botox injection  Family History: Last updated: 01/26/2009 Noncontributory for early coronary artery disease.  Mother died at age 78 of a myocardial infarction. Mother had breast cancer and CAD.  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  The patient denies anorexia, fever, weight loss, chest pain, dyspnea on exertion, hemoptysis, abdominal pain, muscle weakness, depression, and enlarged lymph nodes.    Physical Exam  General:  Pleaasant elderly white female in  no distress Head:  normocephalic and atraumatic.   Eyes:  PERRLA, C&S clear Neck:  supple.   Lungs:  normal respiratory effort and normal breath sounds.   Heart:  normal rate and regular rhythm.   Abdomen:  soft and normal bowel sounds.  Msk:  normal ROM, no joint swelling, no joint warmth, and no redness over joints.   Pulses:  2+ radial Extremities:  trace to 1-+ edema distal LLE. Neurologic:  cranial nerves II-XII intact and gait normal.   Skin:  turgor normal, color normal, and no suspicious lesions.   Cervical Nodes:  no anterior cervical adenopathy and no posterior cervical adenopathy.   Psych:  normally interactive and good eye contact.  Oriented to person and grand-daughter, place.   Impression & Recommendations:  Problem # 1:  FIBRILLATION,  ATRIAL (ICD-427.31) seems to be in a stable rate.  Plan - no changes in regimen  Her updated medication list for this problem includes:    Metoprolol Tartrate 25 Mg Tabs (Metoprolol tartrate) .Marland Kitchen... 1 tab as directed by physcian twice a day    Warfarin Sodium 5 Mg Tabs (Warfarin sodium) ..... One by mouth daily or as directed  Problem # 2:  ANKLE EDEMA (ICD-782.3) Most likely venous insufficiency. No evidence of heart failure.  Plan - knee high support stockings.   Problem # 3:  HYPERTENSION (ICD-401.9)  Her updated medication list for this problem includes:    Metoprolol Tartrate 25 Mg Tabs (Metoprolol tartrate) .Marland Kitchen... 1 tab as directed by physcian twice a day  BP today: 100/64 Prior BP: 122/74 (02/17/2010)  Labs Reviewed: K+: 4.4 (08/16/2009) Creat: : 1.0 (08/16/2009)   Chol: 231 (12/12/2008)   HDL: 64.1 (12/12/2008)   LDL: DEL (12/12/2008)   TG: 71 (12/12/2008)  Exellent control. NO change in medications but will reduce to once daily dosing.   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  Plan continue present meds         family to reorient patient as needed.   Complete Medication List: 1)  Fosamax 70 Mg Tabs (Alendronate sodium) .... Take one tablet by mouth weekly 2)  Fish Oil 1000 Mg Caps (Omega-3 fatty acids) .... Once daily 3)  Dulcolax Stool Softener 100 Mg Caps (Docusate sodium) .... As needed 4)  Vitamin C 500 Mg Tabs (Ascorbic acid) .... Take 1 tablet by mouth once a day 5)  Glucosamine-chondroitin-msm 500-250-250 Mg Caps (Glucosamine-chondroitin-msm) .... Take 1 tablet by mouth once a day 6)  Tylenol Extra Strength 500 Mg Tabs (Acetaminophen) .... As needed 7)  Centrum Tabs (Multiple vitamins-minerals) .... Take 1 tablet by mouth once a day 8)  Metoprolol Tartrate 25 Mg Tabs (Metoprolol tartrate) .Marland Kitchen.. 1 tab as directed by physcian twice a day 9)  Warfarin Sodium 5 Mg Tabs (Warfarin  sodium) .... One by mouth daily or as directed 10)  Caltrate 600+d 600-400 Mg-unit Tabs (Calcium carbonate-vitamin d) .Marland Kitchen.. 1 tablet every evening 11)  Namenda 10 Mg Tabs (Memantine hcl) .Marland Kitchen.. 1 by mouth two times a day  Other Orders: Flu Vaccine 85yrs + MEDICARE PATIENTS (Z6109) Administration Flu vaccine - MCR (U0454)    Flu Vaccine Consent Questions     Do you have a history of severe allergic reactions to this vaccine? no    Any prior history of allergic reactions to egg and/or gelatin? no    Do you have a sensitivity to the preservative Thimersol? no    Do you have a past history of Guillan-Barre Syndrome? no    Do you currently have an acute febrile illness? no    Have you ever had a severe reaction to latex? no    Vaccine information given and explained to patient? yes  Are you currently pregnant? no    Lot Number:[id number]   Exp Date:05/23/2011   Site Given  Left Deltoid IMflu

## 2010-12-23 NOTE — Medication Information (Signed)
Summary: rov/ewj  Anticoagulant Therapy  Managed by: Reina Fuse, PharmD Referring MD: Rollene Rotunda PCP: Jacques Navy MD Supervising MD: Clifton James MD, Cristal Deer Indication 1: Atrial Fibrillation Lab Used: [facility] [place] Site: [address] INR POC 3.2 INR RANGE 2.0-3.0  Dietary changes: no    Health status changes: no    Bleeding/hemorrhagic complications: no    Recent/future hospitalizations: no    Any changes in medication regimen? yes       Details: New med for Parkinsons, but cannot remember what it is. Will call clinic.  Recent/future dental: no  Any missed doses?: no       Is patient compliant with meds? yes      Comments: Cataract surgery scheduled for 11/28.  Allergies: No Known Drug Allergies  Anticoagulation Management History:      The patient is taking warfarin and comes in today for a routine follow up visit.  Positive risk factors for bleeding include an age of 75 years or older and history of CVA/TIA.  The bleeding index is 'intermediate risk'.  Positive CHADS2 values include History of HTN, Age > 75 years old, and Prior Stroke/CVA/TIA.  Anticoagulation responsible provider: Clifton James MD, Cristal Deer.  INR POC: 3.2.  Cuvette Lot#: [id number].  Exp: 10/2011.    Anticoagulation Management Assessment/Plan:      The patient's current anticoagulation dose is Warfarin sodium 5 mg tabs: one by mouth daily or as directed.  The target INR is 2.0 - 3.0.  The next INR is due 11/04/2010.  Anticoagulation instructions were given to patient/Caregiver.  Results were reviewed/authorized by Reina Fuse, PharmD.  She was notified by Reina Fuse PharmD.         Prior Anticoagulation Instructions: INR 2.6  Continue on same dosage 5mg  daily.  Recheck in 4 weeks.    Current Anticoagulation Instructions: INR 3.2  Do not take Coumadin tomorrow, Wednesday, November 23rd. Then, continue taking Coumadin 1 tab (5 mg) every day. Return to clinic in 3 weeks.

## 2010-12-23 NOTE — Letter (Signed)
[facility]   Imported By: Sherian Rein 01/16/2010 09:39:46  _____________________________________________________________________  External Attachment:    Type:   Image     Comment:   External Document

## 2010-12-23 NOTE — Medication Information (Signed)
Summary: rov/tm  Anticoagulant Therapy  Managed by: Weston Brass, PharmD Referring MD: Rollene Rotunda PCP: Jacques Navy MD Supervising MD: Riley Kill MD, Maisie Fus Indication 1: Atrial Fibrillation Lab Used: [facility] [place] Site: [address] INR POC 2.1 INR RANGE 2.0-3.0  Dietary changes: no    Health status changes: no    Bleeding/hemorrhagic complications: no    Recent/future hospitalizations: no    Any changes in medication regimen? no    Recent/future dental: no  Any missed doses?: yes     Details: missed 1 dose last week  Is patient compliant with meds? yes       Allergies: No Known Drug Allergies  Anticoagulation Management History:      The patient is taking warfarin and comes in today for a routine follow up visit.  Positive risk factors for bleeding include an age of 75 years or older and history of CVA/TIA.  The bleeding index is 'intermediate risk'.  Positive CHADS2 values include History of HTN, Age > 26 years old, and Prior Stroke/CVA/TIA.  Anticoagulation responsible provider: Riley Kill MD, Maisie Fus.  INR POC: 2.1.  Cuvette Lot#: [id number].  Exp: 04/2011.    Anticoagulation Management Assessment/Plan:      The patient's current anticoagulation dose is Warfarin sodium 5 mg tabs: one by mouth daily or as directed.  The target INR is 2.0 - 3.0.  The next INR is due 04/28/2010.  Anticoagulation instructions were given to patient/grandaughter.  Results were reviewed/authorized by Weston Brass, PharmD.  She was notified by Weston Brass PharmD.         Prior Anticoagulation Instructions: INR 2.0 Continue 5mg s everyday. Recheck in 4 weeks.   Current Anticoagulation Instructions: INR 2.1  Continue same dose of 1 tablet every day.  Recheck in 4 weeks.

## 2010-12-23 NOTE — Medication Information (Signed)
Summary: rov/sp  Anticoagulant Therapy  Managed by: Weston Brass, PharmD Referring MD: Rollene Rotunda PCP: Jacques Navy MD Supervising MD: Antoine Poche MD, Fayrene Fearing Indication 1: Atrial Fibrillation Lab Used: [facility] [place] Site: [facility] INR POC 2.2 INR RANGE 2.0-3.0  Dietary changes: no    Health status changes: no    Bleeding/hemorrhagic complications: no    Recent/future hospitalizations: no    Any changes in medication regimen? no    Recent/future dental: no  Any missed doses?: no       Is patient compliant with meds? yes       Allergies: No Known Drug Allergies  Anticoagulation Management History:      The patient is taking warfarin and comes in today for a routine follow up visit.  Positive risk factors for bleeding include an age of 75 years or older and history of CVA/TIA.  The bleeding index is 'intermediate risk'.  Positive CHADS2 values include History of HTN, Age > 75 years old, and Prior Stroke/CVA/TIA.  Anticoagulation responsible provider: Antoine Poche MD, Fayrene Fearing.  INR POC: 2.2.  Cuvette Lot#: [id number].  Exp: 06/2011.    Anticoagulation Management Assessment/Plan:      The patient's current anticoagulation dose is Warfarin sodium 5 mg tabs: one by mouth daily or as directed.  The target INR is 2.0 - 3.0.  The next INR is due 05/27/2010.  Anticoagulation instructions were given to patient/grandaughter.  Results were reviewed/authorized by Weston Brass, PharmD.  She was notified by Weston Brass PharmD.         Prior Anticoagulation Instructions: INR 2.1  Continue same dose of 1 tablet every day.  Recheck in 4 weeks.   Current Anticoagulation Instructions: INR 2.2  Continue same dose of 5mg s everyday. Recheck in 4 weeks.

## 2010-12-23 NOTE — Medication Information (Signed)
Summary: rov/jaj  Anticoagulant Therapy  Managed by: Cloyde Reams, RN, BSN Referring MD: Rollene Rotunda PCP: Jacques Navy MD Supervising MD: Shirlee Latch MD, Dalton Indication 1: Atrial Fibrillation Lab Used: [facility] [place] Site: [address] INR POC 2.6 INR RANGE 2.0-3.0  Dietary changes: no    Health status changes: no    Bleeding/hemorrhagic complications: no    Recent/future hospitalizations: yes       Details: Glaucoma surgery 10/20/10 1 eye.    Any changes in medication regimen? no    Recent/future dental: no  Any missed doses?: no       Is patient compliant with meds? yes       Allergies: No Known Drug Allergies  Anticoagulation Management History:      The patient is taking warfarin and comes in today for a routine follow up visit.  Positive risk factors for bleeding include an age of 60 years or older and history of CVA/TIA.  The bleeding index is 'intermediate risk'.  Positive CHADS2 values include History of HTN, Age > 2 years old, and Prior Stroke/CVA/TIA.  Anticoagulation responsible provider: Shirlee Latch MD, Dalton.  INR POC: 2.6.  Cuvette Lot#: [id number].  Exp: 10/2011.    Anticoagulation Management Assessment/Plan:      The patient's current anticoagulation dose is Warfarin sodium 5 mg tabs: one by mouth daily or as directed.  The target INR is 2.0 - 3.0.  The next INR is due 10/14/2010.  Anticoagulation instructions were given to patient/Caregiver.  Results were reviewed/authorized by Cloyde Reams, RN, BSN.  She was notified by Cloyde Reams RN.         Prior Anticoagulation Instructions: INR 2.5  Continue taking 1 tablet everyday. Recheck INR in 4 weeks.   Current Anticoagulation Instructions: INR 2.6  Continue on same dosage 5mg  daily.  Recheck in 4 weeks.

## 2010-12-23 NOTE — Letter (Signed)
Summary: Email from patient's daughter  Email from patient's daughter   Imported By: Sherian Rein 01/07/2010 10:27:13  _____________________________________________________________________  External Attachment:    Type:   Image     Comment:   External Document

## 2010-12-23 NOTE — Medication Information (Signed)
Summary: rov/tm  Anticoagulant Therapy  Managed by: Weston Brass, PharmD Referring MD: Rollene Rotunda PCP: Jacques Navy MD Supervising MD: Eden Emms MD, Theron Arista Indication 1: Atrial Fibrillation Lab [facility]  Site: [facility] INR POC 2.6 INR RANGE 2.0-3.0  Dietary changes: no    Health status changes: no    Bleeding/hemorrhagic complications: no    Recent/future hospitalizations: no    Any changes in medication regimen? no    Recent/future dental: no  Any missed doses?: yes     Details: missed 1 dose but unsure when.  held coumadin x 1 week in July for procedure.   Is patient compliant with meds? yes       Allergies: No Known Drug Allergies  Anticoagulation Management History:      The patient is taking warfarin and comes in today for a routine follow up visit.  Positive risk factors for bleeding include an age of 75 years or older and history of CVA/TIA.  The bleeding index is 'intermediate risk'.  Positive CHADS2 values include History of HTN, Age > 70 years old, and Prior Stroke/CVA/TIA.  Anticoagulation responsible provider: Eden Emms MD, Theron Arista.  INR POC: 2.6.  Cuvette Lot#: [id number].  Exp: 08/2011.    Anticoagulation Management Assessment/Plan:      The patient's current anticoagulation dose is Warfarin sodium 5 mg tabs: one by mouth daily or as directed.  The target INR is 2.0 - 3.0.  The next INR is due 08/19/2010.  Anticoagulation instructions were given to patient/Caregiver.  Results were reviewed/authorized by Weston Brass, PharmD.  She was notified by Weston Brass PharmD.         Prior Anticoagulation Instructions: INR 2.4 Continue 5mg s everyday. Recheck in 4 weeks.   Current Anticoagulation Instructions: INR 2.6  Continue same dose of 1 tablet every day.  Recheck INR in 4 weeks.

## 2010-12-23 NOTE — Assessment & Plan Note (Signed)
Summary: EVAL AND REFILL/ [facility]  #   Vital Signs:  Patient profile:   77 year old female Height:      60 inches Weight:      115 pounds O2 Sat:      97 % on Room air Temp:     96.7 degrees F oral Pulse rate:   87 / minute BP sitting:   138 / 82  (left arm) Cuff size:   regular  Vitals Entered By: Bill Salinas CMA (December 13, 2009 4:12 PM)  O2 Flow:  Room air CC: pt here for follow up since starting namenda/ ab   Primary Care Provider:  Jacques Navy MD  CC:  pt here for follow up since starting namenda/ ab.  History of Present Illness: follow-up on Namenda: she is taking this and doing well. Family (daughter) reports that she does see some cognitive improvement.   Ms. Spofford has had some trouble with gait and balance. She is stumbling but not falling. No focal signs: slurred speech, facial droop, swallow difficulty, motor weakness.  Current Medications (verified): 1)  Fosamax 70 Mg  Tabs (Alendronate Sodium) .... Take One Tablet By Mouth Weekly 2)  Fish Oil 1000 Mg Caps (Omega-3 Fatty Acids) .... Once Daily 3)  Dulcolax Stool Softener 100 Mg Caps (Docusate Sodium) .... As Needed 4)  Vitamin C 500 Mg Tabs (Ascorbic Acid) .... Take 1 Tablet By Mouth Once A Day 5)  Glucosamine-Chondroitin-Msm 500-250-250 Mg Caps (Glucosamine-Chondroitin-Msm) .... Take 1 Tablet By Mouth Once A Day 6)  Tylenol Extra Strength 500 Mg Tabs (Acetaminophen) .... As Needed 7)  Centrum  Tabs (Multiple Vitamins-Minerals) .... Take 1 Tablet By Mouth Once A Day 8)  Metoprolol Tartrate 25 Mg Tabs (Metoprolol Tartrate) .... Two Times A Day 9)  Warfarin Sodium 5 Mg Tabs (Warfarin Sodium) .... One By Mouth Daily or As Directed 10)  Caltrate 600+d 600-400 Mg-Unit Tabs (Calcium Carbonate-Vitamin D) .Marland Kitchen.. 1 Tablet Every Evening 11)  Namenda Titration Pak 5 (28)-10 (21) Mg Tabs (Memantine Hcl) .... [place] As Directed  Allergies (verified): No Known Drug Allergies  Past History:  Past Medical History: Last  updated: 06/03/2009 Breast cancer, hx of-left, s/p lumpectomy '08, also XRT Hyperlipidemia Hypertension Osteoporosis Cerebrovascular accident, hx of '08 Urinary incontinence- irritable bladder Peripheral vascular disease (most recent ABI 0.79 on the right and 0.53 on the left).  Fracture left hip Sept '09 Atrial fibrillation  Past Surgical History: Last updated: 06/03/2009 Tonsillectomy Left breast lumpectomy ORIF left hip Sept '09 G1P1 Cystoscopy with Botox injection  Family History: Last updated: 01/26/2009 Noncontributory for early coronary artery disease.  Mother died at age 51 of a myocardial infarction. Mother had breast cancer and CAD.  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    Physical Exam  General:  eldelry white female in no distress. She has a flat affect Head:  normocephalic and atraumatic.   Eyes:  pupils equal, pupils round, corneas and lenses clear, and no injection.   Neck:  full ROM.   Lungs:  normal respiratory effort and normal breath sounds.   Heart:  normal rate and regular  rhythm.   Msk:  no joint tenderness, no joint swelling, no redness over joints, and no joint deformities.   Neurologic:  awake and alert. She follows 2 step commands. She is able to ambulate with 1 + assistance. Her gait is a "small-stepped" gait with fenestrating turns. she does have some cog-wheeling regiditiy in the uppser extremities.  She does have a very flat affect.  Skin:  turgor  normal, color normal, and no ulcerations.   Psych:  normally interactive, good eye contact, and not anxious appearing.     Impression & Recommendations:  Problem # 1:  ALTERED MENTAL STATUS (ICD-780.97) Patient is doing better on Namenda.  Plan - will continue at 10mg  two times a day.  Problem # 2:  GAIT DISTURBANCE (ICD-781.2)  patient with gait disorder suggestive of a Parinsonian type abnormality.   Plan - referral to nuerology.   Orders: Neurology Referral (Neuro) Home Health Referral (Home Health)  Complete Medication List: 1)  Fosamax 70 Mg Tabs (Alendronate sodium) .... Take one tablet by mouth weekly 2)  Fish Oil 1000 Mg Caps (Omega-3 fatty acids) .... Once daily 3)  Dulcolax Stool Softener 100 Mg Caps (Docusate sodium) .... As needed 4)  Vitamin C 500 Mg Tabs (Ascorbic acid) .... Take 1 tablet by mouth once a day 5)  Glucosamine-chondroitin-msm 500-250-250 Mg Caps (Glucosamine-chondroitin-msm) .... Take 1 tablet by mouth once a day 6)  Tylenol Extra Strength 500 Mg Tabs (Acetaminophen) .... As needed 7)  Centrum Tabs (Multiple vitamins-minerals) .... Take 1 tablet by mouth once a day 8)  Metoprolol Tartrate 25 Mg Tabs (Metoprolol tartrate) .... Two times a day 9)  Warfarin Sodium 5 Mg Tabs (Warfarin sodium) .... One by mouth daily or as directed 10)  Caltrate 600+d 600-400 Mg-unit Tabs (Calcium carbonate-vitamin d) .Marland Kitchen.. 1 tablet every evening 11)  Namenda 10 Mg Tabs (Memantine hcl) .Marland Kitchen.. 1 by mouth two times a day Prescriptions: NAMENDA 10 MG TABS (MEMANTINE HCL) 1 by mouth two times a day  #60 x 12   Entered and Authorized by:   Jacques Navy MD   Signed by:   Jacques Navy MD on 12/15/2009   Method used:   Electronically to        [facility]  [address] [phone]* (retail)       [address]       [place], [place]  [id number]       Ph: [phone] or [phone]       Fax: [phone]   RxID:   [id number]

## 2010-12-23 NOTE — Miscellaneous (Signed)
  Clinical Lists Changes  Observations: Added new observation of ECHOINTERP:  - Left ventricle: The cavity size was normal. Wall thickness was       increased in a pattern of mild LVH. Systolic function was       moderately to severely reduced. The estimated ejection fraction       was in the range of 30% to 35%. Diffuse hypokinesis. The study is       not technically sufficient to allow evaluation of LV diastolic       function.     - Ventricular septum: Septal motion showed abnormal function and       dyssynergy.     - Aortic valve: Mild regurgitation.     - Mitral valve: Mild regurgitation.     - Left atrium: The atrium was moderately dilated.     - Right atrium: The atrium was mildly dilated.     - Pulmonary arteries: Systolic pressure was mildly increased. PA       peak pressure: 43mm Hg (S).     - Pericardium, extracardiac: A trivial pericardial effusion was       identified along the right atrial free wall. (12/02/2009 9:51)      Echocardiogram  Procedure date:  12/02/2009  Findings:       - Left ventricle: The cavity size was normal. Wall thickness was       increased in a pattern of mild LVH. Systolic function was       moderately to severely reduced. The estimated ejection fraction       was in the range of 30% to 35%. Diffuse hypokinesis. The study is       not technically sufficient to allow evaluation of LV diastolic       function.     - Ventricular septum: Septal motion showed abnormal function and       dyssynergy.     - Aortic valve: Mild regurgitation.     - Mitral valve: Mild regurgitation.     - Left atrium: The atrium was moderately dilated.     - Right atrium: The atrium was mildly dilated.     - Pulmonary arteries: Systolic pressure was mildly increased. PA       peak pressure: 43mm Hg (S).     - Pericardium, extracardiac: A trivial pericardial effusion was       identified along the right atrial free wall.

## 2010-12-23 NOTE — Miscellaneous (Signed)
Summary: Plan of Care & Treatment/[facility]  Plan of Care & Treatment/[facility]   Imported By: Sherian Rein 01/20/2010 08:21:32  _____________________________________________________________________  External Attachment:    Type:   Image     Comment:   External Document

## 2010-12-23 NOTE — Medication Information (Signed)
Summary: rov.mp  Anticoagulant Therapy  Managed by: Bethena Midget, RN, BSN Referring MD: Rollene Rotunda PCP: Jacques Navy MD Supervising MD: Tenny Craw MD, Gunnar Fusi Indication 1: Atrial Fibrillation Lab Used: [facility] [place] Site: [address] INR POC 2.3 INR RANGE 2.0-3.0  Dietary changes: no    Health status changes: no    Bleeding/hemorrhagic complications: no    Recent/future hospitalizations: no    Any changes in medication regimen? no    Recent/future dental: no  Any missed doses?: no         Allergies: No Known Drug Allergies  Anticoagulation Management History:      The patient is taking warfarin and comes in today for a routine follow up visit.  Positive risk factors for bleeding include an age of 75 years or older and history of CVA/TIA.  The bleeding index is 'intermediate risk'.  Positive CHADS2 values include History of HTN, Age > 32 years old, and Prior Stroke/CVA/TIA.  Anticoagulation responsible provider: Tenny Craw MD, Gunnar Fusi.  INR POC: 2.3.  Cuvette Lot#: [id number].  Exp: 03/2011.    Anticoagulation Management Assessment/Plan:      The patient's current anticoagulation dose is Warfarin sodium 5 mg tabs: one by mouth daily or as directed.  The target INR is 2.0 - 3.0.  The next INR is due 03/03/2010.  Anticoagulation instructions were given to patient/grandaughter.  Results were reviewed/authorized by Bethena Midget, RN, BSN.  She was notified by Bethena Midget, RN, BSN.         Prior Anticoagulation Instructions: INR 2.3  Continue 1 tab daily  Recheck in 4 weeks.    Current Anticoagulation Instructions: INR 2.3 Continue 5mg s daily. Recheck  in 4 weeks.

## 2010-12-23 NOTE — Assessment & Plan Note (Signed)
Summary: 1 yr rov  427.31  pfh,rn   Visit Type:  Follow-up Primary Provider:  Jacques Navy MD  CC:  Atrial Fibrillation.  History of Present Illness: The patient presents for one year followup. Since I last saw her she has had no new cardiovascular complaints. She gets along doing some minimal household chores. She does not feel her fibrillation. She does not have any presyncope or syncope. She denies any chest pressure, neck or arm discomfort. She has no shortness of breath. She's had no problems with Coumadin.  Current Medications (verified): 1)  Dulcolax Stool Softener 100 Mg Caps (Docusate Sodium) .... As Needed 2)  Vitamin C 500 Mg Tabs (Ascorbic Acid) .... Take 1 Tablet By Mouth Once A Day 3)  Glucosamine-Chondroitin-Msm 500-250-250 Mg Caps (Glucosamine-Chondroitin-Msm) .... Take 1 Tablet By Mouth Once A Day 4)  Tylenol Extra Strength 500 Mg Tabs (Acetaminophen) .... As Needed 5)  Centrum  Tabs (Multiple Vitamins-Minerals) .... Take 1 Tablet By Mouth Once A Day 6)  Metoprolol Tartrate 25 Mg Tabs (Metoprolol Tartrate) .Marland Kitchen.. 1 Tab As Directed By Physcian Twice A Day 7)  Warfarin Sodium 5 Mg Tabs (Warfarin Sodium) .... One By Mouth Daily or As Directed 8)  Caltrate 600+d 600-400 Mg-Unit Tabs (Calcium Carbonate-Vitamin D) .Marland Kitchen.. 1 Tablet Every Evening 9)  Namenda 10 Mg Tabs (Memantine Hcl) .Marland Kitchen.. 1 By Mouth Two Times A Day  Allergies (verified): No Known Drug Allergies  Past History:  Past Medical History: Reviewed history from 06/03/2009 and no changes required. Breast cancer, hx of-left, s/p lumpectomy '08, also XRT Hyperlipidemia Hypertension Osteoporosis Cerebrovascular accident, hx of '08 Urinary incontinence- irritable bladder Peripheral vascular disease (most recent ABI 0.79 on the right and 0.53 on the left).  Fracture left hip Sept '09 Atrial fibrillation  Past Surgical History: Reviewed history from 06/03/2009 and no changes required. Tonsillectomy Left breast  lumpectomy ORIF left hip Sept '09 G1P1 Cystoscopy with Botox injection  Review of Systems       As stated in the HPI and negative for all other systems.   Vital Signs:  Patient profile:   77 year old female Height:      60 inches Weight:      117 pounds BMI:     22.93 Pulse rate:   79 / minute Resp:     16 per minute BP sitting:   104 / 68  (right arm)  Vitals Entered By: Marrion Coy, CNA (September 19, 2010 10:46 AM)  Physical Exam  General:  Well developed, well nourished, in no acute distress. Head:  normocephalic and atraumatic Eyes:  PERRLA/EOM intact; conjunctiva and lids normal. Neck:  Neck supple, no JVD. No masses, thyromegaly or abnormal cervical nodes. Chest Wall:  no deformities or breast masses noted Lungs:  Clear bilaterally to auscultation and percussion. Heart:  Non-displaced PMI, chest non-tender; irregular rate and rhythm, S1, S2 without murmurs, rubs or gallops. Carotid upstroke normal, no bruit. Normal abdominal aortic size, no bruits. Femorals normal pulses, no bruits. Pedals normal pulses. No edema, no varicosities. Abdomen:  Bowel sounds positive; abdomen soft and non-tender without masses, organomegaly, or hernias noted. No hepatosplenomegaly. Msk:  Back normal, normal gait. Muscle strength and tone normal for her age.  lordosis.   Neurologic:  Alert and oriented x 3. Skin:  Intact without lesions or rashes. Psych:  Normal affect.   EKG  Procedure date:  09/19/2010  Findings:      F. atrial fibrillation, rate 84, axis within normal limits, intervals  within normal limits, nonspecific inferolateral T wave changes  Impression & Recommendations:  Problem # 1:  FIBRILLATION, ATRIAL (ICD-427.31) She tolerated rate control and anticoagulation. No change in therapy is indicated.  We discussed Pradaxa but have decided to remain on Coumadin. Orders: EKG w/ Interpretation (93000)  Problem # 2:  HYPERTENSION (ICD-401.9) Her blood pressure is controlled.  She will continue the meds as listed.  Patient Instructions: 1)  Your physician recommends that you schedule a follow-up appointment in: 12 months with Dr Antoine Poche 2)  Your physician recommends that you continue on your current medications as directed. Please refer to the Current Medication list given to you today.

## 2010-12-23 NOTE — Progress Notes (Signed)
Summary: CALL A NURSE  Phone Note Call from Patient   Summary of Call: CALL A NURSE: Pt was out of Namenda. Rx was to be called into pharm at office visit but pharm didn't recieve med. RN on call called in enough for pt to get thru until Monday.   I spoke with granddaughter today. Informed her that rx was sent on the 23rd per EMR, she will check with pharm and call office back if any further problems.  Initial call taken by: Lamar Sprinkles, CMA,  December 16, 2009 2:46 PM  Follow-up for Phone Call        k Follow-up by: Jacques Navy MD,  December 16, 2009 10:33 PM

## 2010-12-23 NOTE — Medication Information (Signed)
Summary: rov/sp  Anticoagulant Therapy  Managed by: Weston Brass, PharmD Referring MD: Rollene Rotunda PCP: Jacques Navy MD Supervising MD: Eden Emms MD, Theron Arista Indication 1: Atrial Fibrillation Lab Used: [facility] [place] Site: [address] INR RANGE 2.0-3.0  Dietary changes: no    Health status changes: no    Bleeding/hemorrhagic complications: no    Recent/future hospitalizations: no    Any changes in medication regimen? no    Recent/future dental: no  Any missed doses?: no       Is patient compliant with meds? yes       Allergies: No Known Drug Allergies  Anticoagulation Management History:      The patient is taking warfarin and comes in today for a routine follow up visit.  Positive risk factors for bleeding include an age of 75 years or older and history of CVA/TIA.  The bleeding index is 'intermediate risk'.  Positive CHADS2 values include History of HTN, Age > 68 years old, and Prior Stroke/CVA/TIA.  Anticoagulation responsible provider: Eden Emms MD, Theron Arista.  Cuvette Lot#: [id number].  Exp: 09/2011.    Anticoagulation Management Assessment/Plan:      The patient's current anticoagulation dose is Warfarin sodium 5 mg tabs: one by mouth daily or as directed.  The target INR is 2.0 - 3.0.  The next INR is due 09/16/2010.  Anticoagulation instructions were given to patient/Caregiver.  Results were reviewed/authorized by Weston Brass, PharmD.  She was notified by Harrel Carina, PharmD candidate.         Prior Anticoagulation Instructions: INR 2.6  Continue same dose of 1 tablet every day.  Recheck INR in 4 weeks.   Current Anticoagulation Instructions: INR 2.5  Continue taking 1 tablet everyday. Recheck INR in 4 weeks.

## 2010-12-23 NOTE — Medication Information (Signed)
Summary: rov/dg  Anticoagulant Therapy  Managed by: Bethena Midget, RN, BSN Referring MD: Rollene Rotunda PCP: Jacques Navy MD Supervising MD: Clifton James MD, Cristal Deer Indication 1: Atrial Fibrillation Lab Used: [facility] [place] Site: [address] INR POC 2.0 INR RANGE 2.0-3.0  Dietary changes: no    Health status changes: yes       Details: Dizziness has improved since b/p meds have been decreased  Bleeding/hemorrhagic complications: no    Recent/future hospitalizations: no    Any changes in medication regimen? yes       Details: metoprolol dose decreased  Recent/future dental: no  Any missed doses?: no       Is patient compliant with meds? yes      Comments: Pending Botox injections into Bladder, has not scheduled yet, waiting for that office to call.   Allergies: No Known Drug Allergies  Anticoagulation Management History:      The patient is taking warfarin and comes in today for a routine follow up visit.  Positive risk factors for bleeding include an age of 75 years or older and history of CVA/TIA.  The bleeding index is 'intermediate risk'.  Positive CHADS2 values include History of HTN, Age > 21 years old, and Prior Stroke/CVA/TIA.  Anticoagulation responsible provider: Clifton James MD, Cristal Deer.  INR POC: 2.0.  Cuvette Lot#: [id number].  Exp: 03/2011.    Anticoagulation Management Assessment/Plan:      The patient's current anticoagulation dose is Warfarin sodium 5 mg tabs: one by mouth daily or as directed.  The target INR is 2.0 - 3.0.  The next INR is due 03/31/2010.  Anticoagulation instructions were given to patient/grandaughter.  Results were reviewed/authorized by Bethena Midget, RN, BSN.  She was notified by Bethena Midget, RN, BSN.         Prior Anticoagulation Instructions: INR 2.3 Continue 5mg s daily. Recheck  in 4 weeks.   Current Anticoagulation Instructions: INR 2.0 Continue 5mg s everyday. Recheck in 4 weeks.

## 2010-12-23 NOTE — Medication Information (Signed)
Summary: rov/tm  Anticoagulant Therapy  Managed by: Bethena Midget, RN, BSN Referring MD: Rollene Rotunda PCP: Jacques Navy MD Supervising MD: Graciela Husbands MD, Viviann Spare Indication 1: Atrial Fibrillation Lab Used: [facility] [place] Site: [address] INR POC 2.4 INR RANGE 2.0-3.0  Dietary changes: no    Health status changes: no    Bleeding/hemorrhagic complications: no    Recent/future hospitalizations: no    Any changes in medication regimen? no    Recent/future dental: no  Any missed doses?: no       Is patient compliant with meds? yes       Allergies: No Known Drug Allergies  Anticoagulation Management History:      The patient is taking warfarin and comes in today for a routine follow up visit.  Positive risk factors for bleeding include an age of 63 years or older and history of CVA/TIA.  The bleeding index is 'intermediate risk'.  Positive CHADS2 values include History of HTN, Age > 5 years old, and Prior Stroke/CVA/TIA.  Anticoagulation responsible provider: Graciela Husbands MD, Viviann Spare.  INR POC: 2.4.  Cuvette Lot#: [id number].  Exp: 07/2011.    Anticoagulation Management Assessment/Plan:      The patient's current anticoagulation dose is Warfarin sodium 5 mg tabs: one by mouth daily or as directed.  The target INR is 2.0 - 3.0.  The next INR is due 06/24/2010.  Anticoagulation instructions were given to patient/Caregiver.  Results were reviewed/authorized by Bethena Midget, RN, BSN.  She was notified by Bethena Midget, RN, BSN.         Prior Anticoagulation Instructions: INR 2.2  Continue same dose of 5mg s everyday. Recheck in 4 weeks.   Current Anticoagulation Instructions: INR 2.4 Continue 5mg s everyday. Recheck in 4 weeks.

## 2010-12-23 NOTE — Progress Notes (Signed)
Summary: mobility/pulse issue  Phone Note Call from Patient Call back at Home Phone [phone]   Caller: Grandaughter--Ms. Fredric Mare Summary of Call: Grandaughter called stating that the patient is having decreased mobility issues (basicly cannot walk or stand), an increased pulse rate (113), but states that she feels ok. She would like to know if you would like for her to have an appt or other recommendations? Initial call taken by: Lucious Groves,  February 12, 2010 9:59 AM  Follow-up for Phone Call        spoke with Ms Fredric Mare and she states when pt was getting out of bed her BP was elevated (150/100) She states they took it after she sat for a few min and it was 90/50. Pt is not having and weakness or dizziness Her grandaughter states they are going to check it after lunch and give  me a call back with an up date.  Follow-up by: Ami Bullins CMA,  February 12, 2010 11:16 AM  Additional Follow-up for Phone Call Additional follow up Details #1::        Pt grandaughter called and checked her bp after being settled in her chair for a while. Pt BP read 110/84 with a pulse of 80, she has a sch office visit on monday OK? Additional Follow-up by: Ami Bullins CMA,  February 12, 2010 2:39 PM    Additional Follow-up for Phone Call Additional follow up Details #2::    k Follow-up by: Jacques Navy MD,  February 12, 2010 5:46 PM

## 2010-12-23 NOTE — Letter (Signed)
Summary: [facility] Consult Scheduled Letter  [facility]  [address] [place], [place] [address]   Phone: [phone]  Fax: [phone]      12/24/2009 MRN: [id number]  Chizaram Piscitelli [address] [place], [place]  [address]    Dear Ms. Rookstool,      We have scheduled an appointment for you.  At the recommendation of Dr.Norins, we have scheduled you a consult with Dr Anne Hahn on 01/23/10 at 9:00am.  Their phone number is [phone].  If this appointment day and time is not convenient for you, please feel free to call the office of the doctor you are being referred to at the number listed above and reschedule the appointment.    [facility] [address] [place], [place] [address]   *Please arrive 30 minutes prior to appointment.*    Thank you,  Patient Care Coordinator [facility]

## 2010-12-23 NOTE — Assessment & Plan Note (Signed)
Summary: decreased mobility issues with increased pulse   Vital Signs:  Patient profile:   77 year old female Height:      60 inches Weight:      118 pounds O2 Sat:      96 % on Room air Temp:     97.3 degrees F oral Pulse rate:   67 / minute BP sitting:   122 / 74  (left arm) Cuff size:   regular  Vitals Entered By: Bill Salinas CMA (February 17, 2010 1:33 PM)  O2 Flow:  Room air CC: pt here for evaluation of decrease mobility issues/ ab   Primary Care Provider:  Jacques Navy MD  CC:  pt here for evaluation of decrease mobility issues/ ab.  History of Present Illness: Patient presents for further evaluation of mobility issues. She did see Dr. Anne Hahn March 3rd - note reviewed: no evidence on physical exam of parkinsonian changes. Follow-up CT was without acute changes. No new medications were initiated. Per daughter and grand-daughter there has been better level of alertness and decreased fatigue. However, she has had a problem with balance and fell sustaining an abrasion to her arm on the 19th. On the 22nd - she was unable to get out of bed due to dizziness and poor balance. She could not stand or get her balance. This has been a problem daily but if she moves slowly and takes her time then she does better. She will usually do better through the course of the day.   The family has reduced her metoprolol freom 25mg  two times a day to 25mg  once daily. She was having hypotension per PT who was working with her in the home. BP  has been reasonably controlled.  In addition she has had pruritis, myalgias. She also has increased SOB, worse in the AM and PM. .No cough, no chest pain, no wheezing  Current Medications (verified): 1)  Fosamax 70 Mg  Tabs (Alendronate Sodium) .... Take One Tablet By Mouth Weekly 2)  Fish Oil 1000 Mg Caps (Omega-3 Fatty Acids) .... Once Daily 3)  Dulcolax Stool Softener 100 Mg Caps (Docusate Sodium) .... As Needed 4)  Vitamin C 500 Mg Tabs (Ascorbic Acid) ....  Take 1 Tablet By Mouth Once A Day 5)  Glucosamine-Chondroitin-Msm 500-250-250 Mg Caps (Glucosamine-Chondroitin-Msm) .... Take 1 Tablet By Mouth Once A Day 6)  Tylenol Extra Strength 500 Mg Tabs (Acetaminophen) .... As Needed 7)  Centrum  Tabs (Multiple Vitamins-Minerals) .... Take 1 Tablet By Mouth Once A Day 8)  Metoprolol Tartrate 25 Mg Tabs (Metoprolol Tartrate) .... Two Times A Day 9)  Warfarin Sodium 5 Mg Tabs (Warfarin Sodium) .... One By Mouth Daily or As Directed 10)  Caltrate 600+d 600-400 Mg-Unit Tabs (Calcium Carbonate-Vitamin D) .Marland Kitchen.. 1 Tablet Every Evening 11)  Namenda 10 Mg Tabs (Memantine Hcl) .Marland Kitchen.. 1 By Mouth Two Times A Day  Allergies (verified): No Known Drug Allergies  Past History:  Past Medical History: Last updated: 06/03/2009 Breast cancer, hx of-left, s/p lumpectomy '08, also XRT Hyperlipidemia Hypertension Osteoporosis Cerebrovascular accident, hx of '08 Urinary incontinence- irritable bladder Peripheral vascular disease (most recent ABI 0.79 on the right and 0.53 on the left).  Fracture left hip Sept '09 Atrial fibrillation  Past Surgical History: Last updated: 06/03/2009 Tonsillectomy Left breast lumpectomy ORIF left hip Sept '09 G1P1 Cystoscopy with Botox injection  Family History: Last updated: 01/26/2009 Noncontributory for early coronary artery disease.  Mother died at age 8 of a myocardial infarction. Mother  had breast cancer and CAD.  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    Physical Exam  General:  elderly white female in no distress in a w/c Head:  Normocephalic and atraumatic without obvious abnormalities. No apparent alopecia or balding. Eyes:  pupils equal and pupils round.   Neck:  supple and full ROM.   Lungs:  normal respiratory effort, normal breath sounds, no crackles, and no wheezes.   Heart:  IRIR rate controlled. No murmurs Abdomen:  soft and non-tender.   Msk:  no joint tenderness, no joint swelling, no joint warmth, and no redness over joints.   Pulses:  2+ radial Neurologic:  subdued but appropriately responsive, alert to surroundings. Follows commands. No delusions. No further exam - deferred to Dr. Anne Hahn' exam in early March. Skin:  color normal, no rashes, no petechiae, and no ulcerations.   Cervical Nodes:  no anterior cervical adenopathy.   Psych:  Oriented X3, memory intact for recent and remote, and normally interactive.     Impression & Recommendations:  Problem # 1:  GAIT DISTURBANCE (ICD-781.2) Per Dr. Anne Hahn exam and note no evidence of overt parkinsonian type disorder. No sign on CT of progressive CVD.  Plan - follow-up with Dr. Anne Hahn as instructed.  Problem # 2:  ALTERED MENTAL STATUS (ICD-780.97) Doing much better cognitively on Namenda per daughter and grand-daughter's report. She does have increased dizziness and some dyspnea, both symptoms are listed side-effects of Namenda. Her exam is negative for pulmonary congestion or CHF.Dizziness may also be attributable to orthostatic hypotension, which may improve with the reduction in beta-blocker therapy.  Plan - no change in medication, i.e. reduction in Namenda dose.           Caution with position change.  Problem # 3:  HYPERTENSION (ICD-401.9)  Her updated medication list for this problem includes:    Metoprolol Tartrate 25 Mg Tabs (Metoprolol tartrate) .Marland Kitchen..Marland Kitchen Two times a day  BP today: 122/74 Prior BP: 138/82 (12/13/2009)  Blood  pressures are stable, without hypotensive episodes especially with activity, on lower dose of metoprolol - 25mg  daily.  Plan - continue present regimen.   Complete Medication List: 1)  Fosamax 70 Mg Tabs (Alendronate sodium) .... Take one tablet by mouth weekly 2)  Fish Oil 1000 Mg Caps (Omega-3 fatty acids) .... Once daily 3)  Dulcolax Stool Softener 100 Mg Caps (Docusate sodium) .... As needed 4)  Vitamin C 500 Mg Tabs (Ascorbic acid) .... Take 1 tablet by mouth once a day 5)  Glucosamine-chondroitin-msm 500-250-250 Mg Caps (Glucosamine-chondroitin-msm) .... Take 1 tablet by mouth once a day 6)  Tylenol Extra Strength 500 Mg Tabs (Acetaminophen) .... As needed 7)  Centrum Tabs (Multiple vitamins-minerals) .... Take 1 tablet by mouth once a day 8)  Metoprolol Tartrate 25 Mg Tabs (Metoprolol tartrate) .... Two times a day 9)  Warfarin Sodium 5 Mg Tabs (Warfarin sodium) .... One by mouth daily or as directed 10)  Caltrate 600+d 600-400 Mg-unit Tabs (Calcium carbonate-vitamin d) .Marland Kitchen.. 1 tablet every evening 11)  Namenda 10 Mg Tabs (Memantine hcl) .Marland Kitchen.. 1 by mouth two times a day

## 2010-12-23 NOTE — Progress Notes (Signed)
----   Converted from flag ---- ---- 12/12/2009 1:11 PM, Jacques Navy MD wrote: patient needs f/u office visit to follow up on dyspnea and to go over her 2D echo results ------------------------------  Pt has appt:  12/13/09 @ 250p w/Dr Debby Bud

## 2010-12-23 NOTE — Medication Information (Signed)
Summary: rov/eh  Anticoagulant Therapy  Managed by: Shelby Dubin, PharmD, BCPS, CPP Referring MD: Rollene Rotunda PCP: Jacques Navy MD Supervising MD: Shirlee Latch MD, Dalton Indication 1: Atrial Fibrillation Lab Used: [facility] [place] Site: [facility] INR POC 2.3 INR RANGE 2.0-3.0  Dietary changes: no    Health status changes: no    Bleeding/hemorrhagic complications: no    Recent/future hospitalizations: no    Any changes in medication regimen? no    Recent/future dental: no  Any missed doses?: no       Is patient compliant with meds? yes       Allergies (verified): No Known Drug Allergies  Anticoagulation Management History:      The patient is taking warfarin and comes in today for a routine follow up visit.  Positive risk factors for bleeding include an age of 17 years or older and history of CVA/TIA.  The bleeding index is 'intermediate risk'.  Positive CHADS2 values include History of HTN, Age > 24 years old, and Prior Stroke/CVA/TIA.  Anticoagulation responsible provider: Shirlee Latch MD, Dalton.  INR POC: 2.3.  Cuvette Lot#: [id number].  Exp: 02/2011.    Anticoagulation Management Assessment/Plan:      The patient's current anticoagulation dose is Warfarin sodium 5 mg tabs: one by mouth daily or as directed.  The target INR is 2.0 - 3.0.  The next INR is due 02/03/2010.  Anticoagulation instructions were given to patient/grandaughter.  Results were reviewed/authorized by Shelby Dubin, PharmD, BCPS, CPP.  She was notified by Shelby Dubin PharmD, BCPS, CPP.         Prior Anticoagulation Instructions: INR 2.0  Continue same dose of 1 tablet daily. Recheck in 4 weeks.  Current Anticoagulation Instructions: INR 2.3  Continue 1 tab daily  Recheck in 4 weeks.

## 2010-12-23 NOTE — Consult Note (Signed)
Summary: [facility]  [facility]   Imported By: Sherian Rein 02/10/2010 08:31:06  _____________________________________________________________________  External Attachment:    Type:   Image     Comment:   External Document

## 2010-12-23 NOTE — Letter (Signed)
Summary: Call A Nurse  Call A Nurse   Imported By: Sherian Rein 12/20/2009 13:57:56  _____________________________________________________________________  External Attachment:    Type:   Image     Comment:   External Document

## 2010-12-23 NOTE — Medication Information (Signed)
Summary: rov/ewj  Anticoagulant Therapy  Managed by: Lew Dawes, PharmD Candidate Referring MD: Rollene Rotunda PCP: Jacques Navy MD Supervising MD: Jens Som MD, Arlys John Indication 1: Atrial Fibrillation Lab Used: [facility] [place] Site: [address] INR POC 2.0 INR RANGE 2.0-3.0  Dietary changes: no    Health status changes: no    Bleeding/hemorrhagic complications: no    Recent/future hospitalizations: no    Any changes in medication regimen? no    Recent/future dental: no  Any missed doses?: no       Is patient compliant with meds? yes       Current Medications (verified): 1)  Fosamax 70 Mg  Tabs (Alendronate Sodium) .... Take One Tablet By Mouth Weekly 2)  Fish Oil 1000 Mg Caps (Omega-3 Fatty Acids) .... Once Daily 3)  Dulcolax Stool Softener 100 Mg Caps (Docusate Sodium) .... As Needed 4)  Vitamin C 500 Mg Tabs (Ascorbic Acid) .... Take 1 Tablet By Mouth Once A Day 5)  Glucosamine-Chondroitin-Msm 500-250-250 Mg Caps (Glucosamine-Chondroitin-Msm) .... Take 1 Tablet By Mouth Once A Day 6)  Tylenol Extra Strength 500 Mg Tabs (Acetaminophen) .... As Needed 7)  Centrum  Tabs (Multiple Vitamins-Minerals) .... Take 1 Tablet By Mouth Once A Day 8)  Metoprolol Tartrate 25 Mg Tabs (Metoprolol Tartrate) .... Two Times A Day 9)  Warfarin Sodium 5 Mg Tabs (Warfarin Sodium) .... One By Mouth Daily or As Directed 10)  Caltrate 600+d 600-400 Mg-Unit Tabs (Calcium Carbonate-Vitamin D) .Marland Kitchen.. 1 Tablet Every Evening 11)  Namenda Titration Pak 5 (28)-10 (21) Mg Tabs (Memantine Hcl) .... [place] As Directed  Allergies (verified): No Known Drug Allergies  Anticoagulation Management History:      The patient is taking warfarin and comes in today for a routine follow up visit.  Positive risk factors for bleeding include an age of 75 years or older and history of CVA/TIA.  The bleeding index is 'intermediate risk'.  Positive CHADS2 values include History of HTN, Age > 75 years old, and Prior  Stroke/CVA/TIA.  Anticoagulation responsible provider: Jens Som MD, Arlys John.  INR POC: 2.0.  Cuvette Lot#: [id number].  Exp: 12/2010.    Anticoagulation Management Assessment/Plan:      The patient's current anticoagulation dose is Warfarin sodium 5 mg tabs: one by mouth daily or as directed.  The target INR is 2.0 - 3.0.  The next INR is due 01/06/2010.  Anticoagulation instructions were given to patient/grandaughter.  Results were reviewed/authorized by Lew Dawes, PharmD Candidate.  She was notified by Lew Dawes, PharmD Candidate.         Prior Anticoagulation Instructions: INR 3.5  Hold today's dose of coumadin then resume same dosage 1 tablet daily.  Call us back with name of antibiotic pt is currently on.  Recheck in 4 weeks.    Current Anticoagulation Instructions: INR 2.0  Continue same dose of 1 tablet daily. Recheck in 4 weeks.

## 2010-12-23 NOTE — Letter (Signed)
Summary: Clearance Letter  Architectural technologist at [facility]. [address]   [place], [place] [address]   Phone: [phone]  Fax: [phone]        June 04, 2010    Re:     [facility] Address:   [address]     [place], [place]  [address] DOB:     1920-01-21 MRN:     [id number]     Dear Dr. McDermont,  Per Dr Rollene Rotunda, the above patient is OK to hold her coumadin for 5 days prior to her upcoming procedure.  If you should have further questions or needs, please call [phone].    Thank you,      Sander Nephew, RN for Dr Rollene Rotunda

## 2010-12-23 NOTE — Letter (Signed)
Summary: Handout Printed  Printed Handout:  - Coumadin Instructions-w/out Meds 

## 2010-12-23 NOTE — Progress Notes (Signed)
Summary: F Y I   Phone Note From Other Clinic   Summary of Call: [facility] called, there is a delay in providing pt PT. Daughter is aware of 5 to 7 day delay and is ok. Call [phone] Shonette Cobb with any questions or concerns.  Initial call taken by: Lamar Sprinkles, CMA,  December 19, 2009 11:07 AM  Follow-up for Phone Call        k Follow-up by: Jacques Navy MD,  December 19, 2009 1:16 PM

## 2010-12-25 NOTE — Medication Information (Signed)
Summary: ROV/KH  Anticoagulant Therapy  Managed by: Tammy Sours PharmD Referring MD: Rollene Rotunda PCP: Jacques Navy MD Supervising MD: Shirlee Latch MD, Necola Bluestein Indication 1: Atrial Fibrillation Lab Used: [facility] [place] [facility] Site: [address] INR POC 3.6 INR RANGE 2.0-3.0  Dietary changes: no    Health status changes: no    Bleeding/hemorrhagic complications: no    Recent/future hospitalizations: yes       Details: left cataract surgery, planned right cataract surgery   Any changes in medication regimen? yes       Details: carbidopa-levodopa, eye drops: vigamonx, durazol, nevanac  Recent/future dental: no  Any missed doses?: no       Is patient compliant with meds? yes       Allergies: No Known Drug Allergies  Anticoagulation Management History:      The patient is taking warfarin and comes in today for a routine follow up visit.  Positive risk factors for bleeding include an age of 65 years or older and history of CVA/TIA.  The bleeding index is 'intermediate risk'.  Positive CHADS2 values include History of HTN, Age > 23 years old, and Prior Stroke/CVA/TIA.  Anticoagulation responsible provider: Shirlee Latch MD, Aanya Haynes.  INR POC: 3.6.  Cuvette Lot#: [id number].  Exp: 10/2011.    Anticoagulation Management Assessment/Plan:      The patient's current anticoagulation dose is Warfarin sodium 5 mg tabs: one by mouth daily or as directed.  The target INR is 2.0 - 3.0.  The next INR is due 11/19/2010.  Anticoagulation instructions were given to patient/Caregiver.  Results were reviewed/authorized by Tammy Sours PharmD.         Prior Anticoagulation Instructions: INR 3.2  Do not take Coumadin tomorrow, Wednesday, November 23rd. Then, continue taking Coumadin 1 tab (5 mg) every day. Return to clinic in 3 weeks.   Current Anticoagulation Instructions: INR 3.6  Skip today's dose of Coumadin. Take 1 tablet everday except 1/2 tablet on Fridays.   Recheck INR in 2 weeks.

## 2010-12-25 NOTE — Medication Information (Signed)
Summary: rov/mwb  Anticoagulant Therapy  Managed by: Weston Brass, PharmD Referring MD: Rollene Rotunda PCP: Jacques Navy MD Supervising MD: Johney Frame MD, Fayrene Fearing Indication 1: Atrial Fibrillation Lab Used: [facility] [place] Site: [facility] INR POC 3.4 INR RANGE 2.0-3.0  Dietary changes: no    Health status changes: no    Bleeding/hemorrhagic complications: no    Recent/future hospitalizations: no    Any changes in medication regimen? no    Recent/future dental: no  Any missed doses?: no       Is patient compliant with meds? yes       Allergies: No Known Drug Allergies  Anticoagulation Management History:      The patient is taking warfarin and comes in today for a routine follow up visit.  Positive risk factors for bleeding include an age of 75 years or older and history of CVA/TIA.  The bleeding index is 'intermediate risk'.  Positive CHADS2 values include History of HTN, Age > 63 years old, and Prior Stroke/CVA/TIA.  Anticoagulation responsible provider: Brahm Barbeau MD, Fayrene Fearing.  INR POC: 3.4.  Exp: 12/2011.    Anticoagulation Management Assessment/Plan:      The patient's current anticoagulation dose is Warfarin sodium 5 mg tabs: one by mouth daily or as directed.  The target INR is 2.0 - 3.0.  The next INR is due 12/17/2010.  Anticoagulation instructions were given to patient/Caregiver.  Results were reviewed/authorized by Weston Brass, PharmD.  She was notified by Weston Brass PharmD.         Prior Anticoagulation Instructions: INR 1.6  Take an extra 1/2 tablet today which will be 1.5 tablet total (7.5mg ) Then go back to previous schedule: Take 1 tablet (5mg ) every day Recheck INR in 2 weeks  Current Anticoagulation Instructions: INR 3.4  Start new dose of 1 tablet every day except 1/2 tablet on Wednesday.  Recheck INR in 2 weeks.

## 2010-12-25 NOTE — Medication Information (Signed)
Summary: Coumadin Clinic  Anticoagulant Therapy  Managed by: Louann Sjogren, PharmD Referring MD: Rollene Rotunda PCP: Jacques Navy MD Supervising MD: Eden Emms MD, Theron Arista Indication 1: Atrial Fibrillation Lab Used: [facility] [place] Site: [address] INR POC 2.9 INR RANGE 2.0-3.0  Dietary changes: no    Health status changes: no    Bleeding/hemorrhagic complications: no    Recent/future hospitalizations: no    Any changes in medication regimen? no    Recent/future dental: no  Any missed doses?: no       Is patient compliant with meds? yes       Current Medications (verified): 1)  Dulcolax Stool Softener 100 Mg Caps (Docusate Sodium) .... As Needed 2)  Glucosamine-Chondroitin-Msm 500-250-250 Mg Caps (Glucosamine-Chondroitin-Msm) .... Take 1 Tablet By Mouth Once A Day 3)  Tylenol Extra Strength 500 Mg Tabs (Acetaminophen) .... As Needed 4)  Centrum  Tabs (Multiple Vitamins-Minerals) .... Take 1 Tablet By Mouth Once A Day 5)  Metoprolol Tartrate 25 Mg Tabs (Metoprolol Tartrate) .Marland Kitchen.. 1 Tab As Directed By Physcian Twice A Day 6)  Warfarin Sodium 5 Mg Tabs (Warfarin Sodium) .... One By Mouth Daily or As Directed 7)  Namenda 10 Mg Tabs (Memantine Hcl) .Marland Kitchen.. 1 By Mouth Two Times A Day 8)  Carbidopa-Levodopa 25-100 Mg Tabs (Carbidopa-Levodopa) .... Take 1/2 Tablet By Mouth Two Times A Day  Allergies: No Known Drug Allergies  Anticoagulation Management History:      The patient is taking warfarin and comes in today for a routine follow up visit.  Positive risk factors for bleeding include an age of 75 years or older and history of CVA/TIA.  The bleeding index is 'intermediate risk'.  Positive CHADS2 values include History of HTN, Age > 61 years old, and Prior Stroke/CVA/TIA.  Today's INR is 2.9.  Anticoagulation responsible provider: Eden Emms MD, Theron Arista.  INR POC: 2.9.  Exp: 12/2011.    Anticoagulation Management Assessment/Plan:      The patient's current anticoagulation dose is  Warfarin sodium 5 mg tabs: one by mouth daily or as directed.  The target INR is 2.0 - 3.0.  The next INR is due 01/14/2011.  Anticoagulation instructions were given to patient/Caregiver.  Results were reviewed/authorized by Louann Sjogren, PharmD.         Prior Anticoagulation Instructions: INR 3.4  Start new dose of 1 tablet every day except 1/2 tablet on Wednesday.  Recheck INR in 2 weeks.   Current Anticoagulation Instructions: INR 2.9 (goal 2-3)  Take 1 tablet everyday except take 1/2 tablet on Saturdays. Recheck INR in 4 weeks.

## 2010-12-25 NOTE — Medication Information (Signed)
Summary: rov/kh  Anticoagulant Therapy  Managed by: Bayard Hugger PharmD Referring MD: Rollene Rotunda PCP: Jacques Navy MD Supervising MD: Riley Kill MD, Maisie Fus Indication 1: Atrial Fibrillation Lab Used: [facility] [place] Site: [address] INR POC 1.6 INR RANGE 2.0-3.0  Dietary changes: no    Health status changes: no    Bleeding/hemorrhagic complications: no    Recent/future hospitalizations: no    Any changes in medication regimen? no    Recent/future dental: no  Any missed doses?: no       Is patient compliant with meds? yes       Allergies: No Known Drug Allergies  Anticoagulation Management History:      Positive risk factors for bleeding include an age of 75 years or older and history of CVA/TIA.  The bleeding index is 'intermediate risk'.  Positive CHADS2 values include History of HTN, Age > 75 years old, and Prior Stroke/CVA/TIA.  Anticoagulation responsible provider: Riley Kill MD, Maisie Fus.  INR POC: 1.6.  Cuvette Lot#: [id number].  Exp: 12/2011.    Anticoagulation Management Assessment/Plan:      The patient's current anticoagulation dose is Warfarin sodium 5 mg tabs: one by mouth daily or as directed.  The target INR is 2.0 - 3.0.  The next INR is due 12/03/2010.  Anticoagulation instructions were given to patient/Caregiver.  Results were reviewed/authorized by Bayard Hugger PharmD.         Prior Anticoagulation Instructions: INR 3.6  Skip today's dose of Coumadin. Take 1 tablet everday except 1/2 tablet on Fridays.   Recheck INR in 2 weeks.   Current Anticoagulation Instructions: INR 1.6  Take an extra 1/2 tablet today which will be 1.5 tablet total (7.5mg ) Then go back to previous schedule: Take 1 tablet (5mg ) every day Recheck INR in 2 weeks

## 2011-01-01 ENCOUNTER — Other Ambulatory Visit: Payer: Self-pay | Admitting: Oncology

## 2011-01-01 ENCOUNTER — Encounter: Payer: Self-pay | Admitting: Internal Medicine

## 2011-01-01 ENCOUNTER — Encounter (HOSPITAL_BASED_OUTPATIENT_CLINIC_OR_DEPARTMENT_OTHER): Payer: MEDICARE | Admitting: Oncology

## 2011-01-01 DIAGNOSIS — C50119 Malignant neoplasm of central portion of unspecified female breast: Secondary | ICD-10-CM

## 2011-01-01 DIAGNOSIS — Z171 Estrogen receptor negative status [ER-]: Secondary | ICD-10-CM

## 2011-01-01 DIAGNOSIS — M810 Age-related osteoporosis without current pathological fracture: Secondary | ICD-10-CM

## 2011-01-01 DIAGNOSIS — I10 Essential (primary) hypertension: Secondary | ICD-10-CM

## 2011-01-01 LAB — CBC WITH DIFFERENTIAL/PLATELET
EOS%: 1 % (ref 0.0–7.0)
HGB: 14.5 g/dL (ref 11.6–15.9)
MCH: 30.9 pg (ref 25.1–34.0)
MCV: 94.3 fL (ref 79.5–101.0)
MONO%: 9.1 % (ref 0.0–14.0)
NEUT#: 4.4 10*3/uL (ref 1.5–6.5)
RBC: 4.7 10*6/uL (ref 3.70–5.45)
RDW: 13.4 % (ref 11.2–14.5)
lymph#: 1.3 10*3/uL (ref 0.9–3.3)
nRBC: 1 % — ABNORMAL HIGH (ref 0–0)

## 2011-01-04 ENCOUNTER — Encounter: Payer: Self-pay | Admitting: Internal Medicine

## 2011-01-06 DIAGNOSIS — I6350 Cerebral infarction due to unspecified occlusion or stenosis of unspecified cerebral artery: Secondary | ICD-10-CM

## 2011-01-06 DIAGNOSIS — Z7901 Long term (current) use of anticoagulants: Secondary | ICD-10-CM

## 2011-01-06 DIAGNOSIS — I4891 Unspecified atrial fibrillation: Secondary | ICD-10-CM

## 2011-01-14 ENCOUNTER — Encounter: Payer: Self-pay | Admitting: Cardiology

## 2011-01-14 ENCOUNTER — Encounter (INDEPENDENT_AMBULATORY_CARE_PROVIDER_SITE_OTHER): Payer: Medicare Other

## 2011-01-14 DIAGNOSIS — Z7901 Long term (current) use of anticoagulants: Secondary | ICD-10-CM

## 2011-01-14 DIAGNOSIS — I4891 Unspecified atrial fibrillation: Secondary | ICD-10-CM

## 2011-01-14 LAB — CONVERTED CEMR LAB: POC INR: 4.2

## 2011-01-20 NOTE — Medication Information (Signed)
Summary: rov/sp  Anticoagulant Therapy  Managed by: Windell Hummingbird, RN Referring MD: Rollene Rotunda PCP: Jacques Navy MD Supervising MD: Shirlee Latch MD, Dalton Indication 1: Atrial Fibrillation Lab Used: [facility] [place] Site: [address] INR POC 4.2 INR RANGE 2.0-3.0  Dietary changes: no    Health status changes: no    Bleeding/hemorrhagic complications: no    Recent/future hospitalizations: no    Any changes in medication regimen? no    Recent/future dental: no  Any missed doses?: no       Is patient compliant with meds? yes       Allergies: No Known Drug Allergies  Anticoagulation Management History:      The patient is taking warfarin and comes in today for a routine follow up visit.  Positive risk factors for bleeding include an age of 75 years or older and history of CVA/TIA.  The bleeding index is 'intermediate risk'.  Positive CHADS2 values include History of HTN, Age > 42 years old, and Prior Stroke/CVA/TIA.  Her last INR was 2.9.  Anticoagulation responsible provider: Shirlee Latch MD, Dalton.  INR POC: 4.2.  Cuvette Lot#: [id number].  Exp: 11/2011.    Anticoagulation Management Assessment/Plan:      The patient's current anticoagulation dose is Warfarin sodium 5 mg tabs: one by mouth daily or as directed.  The target INR is 2.0 - 3.0.  The next INR is due 01/21/2011.  Anticoagulation instructions were given to patient/Caregiver.  Results were reviewed/authorized by Windell Hummingbird, RN.  She was notified by Windell Hummingbird, RN.         Prior Anticoagulation Instructions: INR 2.9 (goal 2-3)  Take 1 tablet everyday except take 1/2 tablet on Saturdays. Recheck INR in 4 weeks.  Current Anticoagulation Instructions: INR 4.2 Do not take Coumadin today or tomorrow.  Then begin taking 1 tablet every day, except take 1/2 tablet on Mondays, Wednesdays, and Saturdays. Recheck in 1 week.

## 2011-01-21 ENCOUNTER — Encounter: Payer: Self-pay | Admitting: Cardiovascular Disease

## 2011-01-21 ENCOUNTER — Encounter (INDEPENDENT_AMBULATORY_CARE_PROVIDER_SITE_OTHER): Payer: Medicare Other

## 2011-01-21 DIAGNOSIS — I4891 Unspecified atrial fibrillation: Secondary | ICD-10-CM

## 2011-01-21 DIAGNOSIS — Z7901 Long term (current) use of anticoagulants: Secondary | ICD-10-CM

## 2011-01-21 LAB — CONVERTED CEMR LAB: POC INR: 2.1

## 2011-01-22 ENCOUNTER — Encounter: Payer: Self-pay | Admitting: Internal Medicine

## 2011-01-29 NOTE — Medication Information (Signed)
Summary: rov/pc  Anticoagulant Therapy  Managed by: Weston Brass, PharmD Referring MD: Rollene Rotunda PCP: Jacques Navy MD Supervising MD: Excell Seltzer MD, Casimiro Needle Indication 1: Atrial Fibrillation Lab Used: [facility] [place] Site: [address] INR POC 2.1 INR RANGE 2.0-3.0  Dietary changes: no    Health status changes: no    Bleeding/hemorrhagic complications: no    Recent/future hospitalizations: no    Any changes in medication regimen? yes       Details: stopped carbidopa/levodopa 3 days ago  Recent/future dental: no  Any missed doses?: no       Is patient compliant with meds? yes       Allergies: No Known Drug Allergies  Anticoagulation Management History:      The patient is taking warfarin and comes in today for a routine follow up visit.  Positive risk factors for bleeding include an age of 75 years or older and history of CVA/TIA.  The bleeding index is 'intermediate risk'.  Positive CHADS2 values include History of HTN, Age > 22 years old, and Prior Stroke/CVA/TIA.  Her last INR was 2.9.  Anticoagulation responsible provider: Excell Seltzer MD, Casimiro Needle.  INR POC: 2.1.  Cuvette Lot#: [id number].  Exp: 11/2011.    Anticoagulation Management Assessment/Plan:      The patient's current anticoagulation dose is Warfarin sodium 5 mg tabs: one by mouth daily or as directed.  The target INR is 2.0 - 3.0.  The next INR is due 02/04/2011.  Anticoagulation instructions were given to patient/Caregiver.  Results were reviewed/authorized by Weston Brass, PharmD.  She was notified by Margot Chimes PharmD Candidate.         Prior Anticoagulation Instructions: INR 4.2 Do not take Coumadin today or tomorrow.  Then begin taking 1 tablet every day, except take 1/2 tablet on Mondays, Wednesdays, and Saturdays. Recheck in 1 week.  Current Anticoagulation Instructions: INR 2.1    Continue to take 1 tablet daily except on Mondays, Wednesdays, and Saturdays when you only take 1/2 tablet.  Recheck INR  in 2 weeks.

## 2011-02-03 NOTE — Letter (Signed)
Summary: [facility]  [facility]   Imported By: Sherian Rein 01/27/2011 14:20:14  _____________________________________________________________________  External Attachment:    Type:   Image     Comment:   External Document

## 2011-02-03 NOTE — Consult Note (Signed)
Summary: [facility]  [facility]   Imported By: Lennie Odor 01/29/2011 09:52:39  _____________________________________________________________________  External Attachment:    Type:   Image     Comment:   External Document

## 2011-02-03 NOTE — Progress Notes (Addendum)
Summary: [facility]: Office Visit   [facility] Visit   Imported By: Earl Many 01/30/2011 14:28:18  _____________________________________________________________________  External Attachment:    Type:   Image     Comment:   External Document

## 2011-02-04 ENCOUNTER — Encounter: Payer: Self-pay | Admitting: Cardiology

## 2011-02-04 ENCOUNTER — Encounter (INDEPENDENT_AMBULATORY_CARE_PROVIDER_SITE_OTHER): Payer: Medicare Other

## 2011-02-04 DIAGNOSIS — Z7901 Long term (current) use of anticoagulants: Secondary | ICD-10-CM

## 2011-02-04 DIAGNOSIS — I4891 Unspecified atrial fibrillation: Secondary | ICD-10-CM

## 2011-02-04 LAB — CONVERTED CEMR LAB: POC INR: 3.3

## 2011-02-07 LAB — BASIC METABOLIC PANEL
Calcium: 9 mg/dL (ref 8.4–10.5)
GFR calc Af Amer: 59 mL/min — ABNORMAL LOW (ref >60)
GFR calc non Af Amer: 49 mL/min — ABNORMAL LOW (ref >60)
Glucose, Bld: 129 mg/dL — ABNORMAL HIGH (ref 70–99)
Sodium: 139 mEq/L (ref 135–145)

## 2011-02-07 LAB — POCT HEMOGLOBIN-HEMACUE: Hemoglobin: 13.7 g/dL (ref 12.0–15.0)

## 2011-02-09 ENCOUNTER — Ambulatory Visit (INDEPENDENT_AMBULATORY_CARE_PROVIDER_SITE_OTHER): Payer: Medicare Other | Admitting: Internal Medicine

## 2011-02-09 ENCOUNTER — Telehealth: Payer: Self-pay | Admitting: Internal Medicine

## 2011-02-09 ENCOUNTER — Encounter: Payer: Self-pay | Admitting: Internal Medicine

## 2011-02-09 DIAGNOSIS — J069 Acute upper respiratory infection, unspecified: Secondary | ICD-10-CM

## 2011-02-10 NOTE — Letter (Signed)
Summary: [facility]  [facility]   Imported By: Lennie Odor 01/29/2011 09:53:51  _____________________________________________________________________  External Attachment:    Type:   Image     Comment:   External Document

## 2011-02-10 NOTE — Medication Information (Signed)
Summary: rov/cb  Anticoagulant Therapy  Managed by: Bethena Midget, RN, BSN Referring MD: Rollene Rotunda PCP: Jacques Navy MD Supervising MD: Riley Kill MD, Maisie Fus Indication 1: Atrial Fibrillation Lab Used: [facility] [place] Site: [address] INR POC 3.3 INR RANGE 2.0-3.0  Dietary changes: yes       Details: Did eat less leafy veggies  Health status changes: no    Bleeding/hemorrhagic complications: no    Recent/future hospitalizations: no    Any changes in medication regimen? no    Recent/future dental: no  Any missed doses?: no       Is patient compliant with meds? yes       Allergies: No Known Drug Allergies  Anticoagulation Management History:      The patient is taking warfarin and comes in today for a routine follow up visit.  Positive risk factors for bleeding include an age of 30 years or older and history of CVA/TIA.  The bleeding index is 'intermediate risk'.  Positive CHADS2 values include History of HTN, Age > 72 years old, and Prior Stroke/CVA/TIA.  Her last INR was 2.9.  Anticoagulation responsible provider: Riley Kill MD, Maisie Fus.  INR POC: 3.3.  Cuvette Lot#: [id number].  Exp: 12/2011.    Anticoagulation Management Assessment/Plan:      The patient's current anticoagulation dose is Warfarin sodium 5 mg tabs: one by mouth daily or as directed.  The target INR is 2.0 - 3.0.  The next INR is due 02/18/2011.  Anticoagulation instructions were given to patient/Caregiver.  Results were reviewed/authorized by Bethena Midget, RN, BSN.  She was notified by Bethena Midget, RN, BSN.         Prior Anticoagulation Instructions: INR 2.1    Continue to take 1 tablet daily except on Mondays, Wednesdays, and Saturdays when you only take 1/2 tablet.  Recheck INR in 2 weeks.   Current Anticoagulation Instructions: INR 3.3 Skip today's dose then resume 1 pill everyday except 1/2 pill on Mondays, Wednesdays and Saturdays. Recheck in 2 weeks.

## 2011-02-12 ENCOUNTER — Ambulatory Visit (INDEPENDENT_AMBULATORY_CARE_PROVIDER_SITE_OTHER): Payer: Medicare Other | Admitting: Internal Medicine

## 2011-02-12 ENCOUNTER — Ambulatory Visit (INDEPENDENT_AMBULATORY_CARE_PROVIDER_SITE_OTHER)
Admission: RE | Admit: 2011-02-12 | Discharge: 2011-02-12 | Disposition: A | Discharge status: Home / Self Care | Payer: Medicare Other | Source: Ambulatory Visit | Attending: Internal Medicine | Admitting: Internal Medicine

## 2011-02-12 ENCOUNTER — Other Ambulatory Visit (INDEPENDENT_AMBULATORY_CARE_PROVIDER_SITE_OTHER): Payer: Medicare Other

## 2011-02-12 ENCOUNTER — Encounter: Payer: Self-pay | Admitting: *Deleted

## 2011-02-12 VITALS — BP 110/70 | HR 94 | Temp 97.2°F

## 2011-02-12 VITALS — BP 110/70 | HR 94 | Temp 97.2°F | Ht 60.0 in

## 2011-02-12 DIAGNOSIS — J069 Acute upper respiratory infection, unspecified: Secondary | ICD-10-CM

## 2011-02-12 DIAGNOSIS — J90 Pleural effusion, not elsewhere classified: Secondary | ICD-10-CM

## 2011-02-12 DIAGNOSIS — R0602 Shortness of breath: Secondary | ICD-10-CM

## 2011-02-12 LAB — COMPREHENSIVE METABOLIC PANEL
AST: 30 U/L (ref 0–37)
Alkaline Phosphatase: 45 U/L (ref 39–117)
BUN: 23 mg/dL (ref 6–23)
Creatinine, Ser: 1 mg/dL (ref 0.4–1.2)
Potassium: 4.2 mEq/L (ref 3.5–5.1)
Total Bilirubin: 0.7 mg/dL (ref 0.3–1.2)

## 2011-02-12 LAB — CBC WITH DIFFERENTIAL/PLATELET
Basophils Relative: 0 % (ref 0.0–3.0)
Eosinophils Absolute: 0 10*3/uL (ref 0.0–0.7)
HCT: 38.4 % (ref 36.0–46.0)
Lymphs Abs: 0.6 10*3/uL — ABNORMAL LOW (ref 0.7–4.0)
MCHC: 33.7 g/dL (ref 30.0–36.0)
MCV: 93.1 fl (ref 78.0–100.0)
Monocytes Absolute: 0.5 10*3/uL (ref 0.1–1.0)
Neutro Abs: 5.1 10*3/uL (ref 1.4–7.7)
Neutrophils Relative %: 82.2 % — ABNORMAL HIGH (ref 43.0–77.0)
RBC: 4.13 Mil/uL (ref 3.87–5.11)

## 2011-02-12 MED ORDER — FUROSEMIDE 20 MG PO TABS: 20.0000 mg | ORAL_TABLET | Freq: Two times a day (BID) | ORAL | Status: DC

## 2011-02-12 NOTE — Progress Notes (Signed)
  Subjective:    Patient ID: [name], female    DOB: 10-26-1920, 77 y.o.   MRN: [id number]  HPI for chest x-ray prior to visit for acute evaluation    Review of Systems     Objective:   Physical Exam        Assessment & Plan:

## 2011-02-12 NOTE — Patient Instructions (Signed)
1. Respiratory infection - no evidence on chest x-ray. No fever. Plan - CBC, please finish avelox 2. Pleural effusions - concern for heart failure, consistent with known vascular disease.      Plan - lab- including BNP for heart failure                Furosemide 20mg  AM and PM for 5 days then once a day               2 D echo to look at heart function               Rule of "2's"....Marland Kitchenweigh every day at the same time in the same garb: if you can 2 + lbs in 2 days double the furosemide for 2 days                Return in 1 week.

## 2011-02-13 LAB — BRAIN NATRIURETIC PEPTIDE: Pro B Natriuretic peptide (BNP): 1055.4 pg/mL — ABNORMAL HIGH (ref 0.0–100.0)

## 2011-02-15 NOTE — Progress Notes (Signed)
Subjective:    Patient ID: [name], female    DOB: 18-Feb-1920, 77 y.o.   MRN: [id number]  HPI   [name] was seen March 19th for low grade fever and cough. Her exam was unremarkable but given her age and frail health she was treated with Avelox 400mg  qd for a presumed bronchitis. She returns today for continued ill health and cough along with mild SOB. She did have CXR prior to being seen. Image was reviewed and discussed with radiology: no evidence of infection but bilateral pleural effusions were noted, increased Cardiomegaly when compared to last study in Julay '11 and continued prominence of pulmonary artery. This does suggest CHF which would be new.    Review of Systems  Constitutional: Positive for fever. Negative for chills, diaphoresis, appetite change and unexpected weight change.  HENT: Negative for hearing loss, congestion, neck pain, neck stiffness, postnasal drip and tinnitus.   Eyes: Negative.   Respiratory: Positive for cough and shortness of breath. Negative for wheezing.   Cardiovascular: Negative for chest pain and palpitations.  Gastrointestinal: Negative.   Genitourinary: Negative for dysuria, urgency, frequency, flank pain and decreased urine volume.  Musculoskeletal: Positive for back pain and arthralgias.  Neurological: Negative for dizziness, speech difficulty, weakness and headaches.  Hematological: Negative.   Psychiatric/Behavioral: Negative.        Objective:   Physical Exam  Constitutional: She is oriented to person, place, and time. She appears well-nourished.       Elderly white woman sitting in a w/c in no acute distress.  HENT:  Head: Normocephalic and atraumatic.  Mouth/Throat: Oropharynx is clear and moist.  Eyes: Conjunctivae and EOM are normal.  Neck: Normal range of motion. Neck supple. No JVD present. No thyromegaly present.  Cardiovascular: Normal rate, regular rhythm and normal heart sounds.   Pulmonary/Chest: Breath sounds normal. No  respiratory distress. She has no rales.  Abdominal: Soft.  Musculoskeletal: Normal range of motion.  Neurological: She is oriented to person, place, and time.  Skin: Skin is warm and dry.  Psychiatric: She has a normal mood and affect. Thought content normal.   Lab Results  Component Value Date   WBC 6.3 02/12/2011   HGB 13.0 02/12/2011   HCT 38.4 02/12/2011   PLT 157.0 02/12/2011   CHOL 231* 12/12/2008   TRIG 71 12/12/2008   HDL 64.1 12/12/2008   LDLDIRECT 151.4 12/12/2008   ALT 22 02/12/2011   AST 30 02/12/2011   NA 137 02/12/2011   K 4.2 02/12/2011   CL 102 02/12/2011   CREATININE 1.0 02/12/2011   BUN 23 02/12/2011   CO2 28 02/12/2011   TSH 2.22 08/16/2009   INR 2.9 12/17/2010   HGBA1C 5.9 12/12/2008        BNP 1054  .last       Assessment & Plan:  1. URI - WBC normal, no fever, no productive sputum. CXR without evidence of infection.  Plan - complete avalox   2. CHF - patient with h/o vascular disease now with increased weakness, mild shortness of breath and an abnormal CXR. BNP was ordered at this visit and came back very elevated.  Plan - Furosemide 20mg  bid for 5 days then once a day            "Rule of 2's" - explained to patients family and to start weights after initial diuresis           2 D echo  ROV 1 week

## 2011-02-18 ENCOUNTER — Ambulatory Visit (INDEPENDENT_AMBULATORY_CARE_PROVIDER_SITE_OTHER): Payer: Medicare Other | Admitting: Internal Medicine

## 2011-02-18 ENCOUNTER — Encounter: Payer: Self-pay | Admitting: Internal Medicine

## 2011-02-18 ENCOUNTER — Encounter: Payer: Medicare Other | Admitting: *Deleted

## 2011-02-18 ENCOUNTER — Ambulatory Visit (INDEPENDENT_AMBULATORY_CARE_PROVIDER_SITE_OTHER)
Admission: RE | Admit: 2011-02-18 | Discharge: 2011-02-18 | Disposition: A | Discharge status: Home / Self Care | Payer: Medicare Other | Source: Ambulatory Visit | Attending: Internal Medicine | Admitting: Internal Medicine

## 2011-02-18 VITALS — BP 88/68 | HR 111 | Temp 98.2°F | Resp 12 | Wt 111.4 lb

## 2011-02-18 DIAGNOSIS — I509 Heart failure, unspecified: Secondary | ICD-10-CM

## 2011-02-18 NOTE — Patient Instructions (Signed)
Happy that your are feeling better. Plan - follow-up chest x-ray today; take furosemide 20mg  once a day; institute the "rule of 2's." I will let you know about the x-ray results. We are tracking down the order for the 2D echo of the heart and you will be notified.   Continue all other medications.  Call me for problems.

## 2011-02-18 NOTE — Progress Notes (Signed)
  Subjective:    Patient ID: [name], female    DOB: [birth year], 77 y.o.   MRN: [id number]  HPI Patient last seen march 22nd note reviewed: she was diagnosed with probable CHF and was diuresed. She has had very good urine output, decreased cough and decreased SOB. Her BP has been a little low from her baseline of 120-130/80's.  PMH, SocHx- no change since last visit   Review of Systems  Constitutional:       [Mild weight loss. Low BP at home. HENT: Negative.   Respiratory: Negative for cough, chest tightness, shortness of breath and wheezing.        [Reduced coughing, improved breathing Cardiovascular: Negative for chest pain, palpitations and leg swelling.       Objective:   Physical Exam  Constitutional:       Eldelry and frail white woman in no distress  HENT:  Head: Normocephalic and atraumatic.  Eyes: Conjunctivae are normal. No scleral icterus.  Neck: Neck supple.  Cardiovascular: Normal rate and regular rhythm.   Pulmonary/Chest: Effort normal and breath sounds normal. She has no wheezes. She has no rales.          Assessment & Plan:  1. CHF- patient with recent new diagnosis of CHF - doing better after gentle diueresis with low dose lasix. Her BP is down but she is tolerating this well.  Plan - f/u 2D echo scheduling           F/u CXR today           Reduce furosemide to 20mg  daily           "Rule of 2's"

## 2011-02-19 ENCOUNTER — Other Ambulatory Visit (HOSPITAL_COMMUNITY): Payer: Medicare Other | Admitting: Radiology

## 2011-02-19 ENCOUNTER — Telehealth: Payer: Self-pay | Admitting: Internal Medicine

## 2011-02-19 NOTE — Telephone Encounter (Signed)
Informed pt's granddaughter Birdie Riddle of advisement.

## 2011-02-19 NOTE — Telephone Encounter (Signed)
Please call patient and daughter or grand-dtr - x-ray reveals resolution of fluid overload. No change in instructions.   THANK

## 2011-02-19 NOTE — Progress Notes (Signed)
  Phone Note Other Incoming   Caller: [phone] Summary of Call: pt has temp of 99.2, she has elevated pulse of 102 and is very weak. Pts grandaughter states pt is more confused than normal and states symptoms started yesterday. Pt also has congestion and is losing her voice. Pt cannot stand with out assistance and cannot hold anything due to extreme weakness. You are booked what do you suggest for pt?  Follow-up for Phone Call        either office visit and probable admission or to hospital for evaluation and possible admission. Offic eis better Follow-up by: Jacques Navy MD,  February 09, 2011 9:06 AM  Additional Follow-up for Phone Call Additional follow up Details #1::        nancy to call pt to set up appt to come in today Additional Follow-up by: Ami Bullins CMA,  February 09, 2011 9:26 AM    Additional Follow-up for Phone Call Additional follow up Details #2::    pt here for appt with Dr Debby Bud Follow-up by: Bill Salinas CMA,  February 09, 2011 4:37 PM

## 2011-02-19 NOTE — Assessment & Plan Note (Signed)
Summary: WEAK/ LOW FEVER/ PER FLAG/ WILL CALL BACK IF CAN'T COME TODAY...   Vital Signs:  Patient profile:   77 year old female Height:      60 inches O2 Sat:      93 % on Room air Temp:     98.1 degrees F oral Pulse rate:   84 / minute BP sitting:   112 / 62  (left arm) Cuff size:   regular  Vitals Entered By: Bill Salinas CMA (February 09, 2011 4:08 PM)  O2 Flow:  Room air CC: pt had temp yesterday (staying around 99), pts grandaughter states that her pulse was stay in the low 100s with hoarseness, SOB, decreased appetite and increased confusion/ ab   Primary Care Provider:  Jacques Navy MD  CC:  pt had temp yesterday (staying around 99), pts grandaughter states that her pulse was stay in the low 100s with hoarseness, SOB, and decreased appetite and increased confusion/ ab.  History of Present Illness: [name], and 77 yo, has been ill with a fever at home of 99+, increased somnolence, tachycardia, shortness of breath and tachypnea. she denies any dysuria or UTI symtoms. she does admit to a cough which is enough to make her chest hurt. She has had a poor appetite and she has been hard to arouse.    Current Medications (verified): 1)  Dulcolax Stool Softener 100 Mg Caps (Docusate Sodium) .... As Needed 2)  Glucosamine-Chondroitin-Msm 500-250-250 Mg Caps (Glucosamine-Chondroitin-Msm) .... Take 1 Tablet By Mouth Once A Day 3)  Tylenol Extra Strength 500 Mg Tabs (Acetaminophen) .... As Needed 4)  Centrum  Tabs (Multiple Vitamins-Minerals) .... Take 1 Tablet By Mouth Once A Day 5)  Metoprolol Tartrate 25 Mg Tabs (Metoprolol Tartrate) .Marland Kitchen.. 1 Tab As Directed By Physcian Twice A Day 6)  Warfarin Sodium 5 Mg Tabs (Warfarin Sodium) .... One By Mouth Daily or As Directed 7)  Namenda 10 Mg Tabs (Memantine Hcl) .Marland Kitchen.. 1 By Mouth Two Times A Day  Allergies (verified): No Known Drug Allergies  Past History:  Past Medical History: Last updated: 06/03/2009 Breast cancer, hx of-left, s/p  lumpectomy '08, also XRT Hyperlipidemia Hypertension Osteoporosis Cerebrovascular accident, hx of '08 Urinary incontinence- irritable bladder Peripheral vascular disease (most recent ABI 0.79 on the right and 0.53 on the left).  Fracture left hip Sept '09 Atrial fibrillation  Past Surgical History: Last updated: 06/03/2009 Tonsillectomy Left breast lumpectomy ORIF left hip Sept '09 G1P1 Cystoscopy with Botox injection FH reviewed for relevance, SH/Risk Factors reviewed for relevance  Review of Systems       The patient complains of anorexia, fever, decreased hearing, hoarseness, dyspnea on exertion, and difficulty walking.  The patient denies weight loss, weight gain, vision loss, chest pain, syncope, peripheral edema, headaches, abdominal pain, muscle weakness, depression, abnormal bleeding, and enlarged lymph nodes.    Physical Exam  General:  eldelry white woman who is somewhat subdued but in no acute distress.  Head:  normocephalic and atraumatic.   Eyes:  vision grossly intact, pupils equal, and pupils round.   Neck:  supple and no masses.   Lungs:  normal respiratory effort, no intercostal retractions, no accessory muscle use, normal breath sounds, no crackles, and no wheezes.   Heart:  normal rate and regular rhythm.   Abdomen:  soft, non-tender, normal bowel sounds, no guarding, and no rigidity.   Msk:  normal ROM, no joint tenderness, no joint swelling, and no joint deformities.   Pulses:  2+ radial  pulse Extremities:  no peripheral edema Neurologic:  alert & oriented X3, cranial nerves II-XII intact, and gait normal.   Skin:  poor turgor Cervical Nodes:  no anterior cervical adenopathy and no posterior cervical adenopathy.   Axillary Nodes:  no R axillary adenopathy and no L axillary adenopathy.   Psych:  memory intact for recent and remote and normally interactive.     Impression & Recommendations:  Problem # 1:  HYPERTENSION (ICD-401.9)  Her updated  medication list for this problem includes:    Metoprolol Tartrate 25 Mg Tabs (Metoprolol tartrate) .Marland Kitchen... 1 tab as directed by physcian twice a day  BP today: 112/62 Prior BP: 104/68 (09/19/2010)  Good control of  BP  Problem # 2:  URI (ICD-465.9) elderly patient who at exam is afebrile, but with her age it is possible she is infected without the usual response. She does have a raucous cough.  Plan - avelox 400mg  once daily x 5 days           robitussin DM 1 tsp every 6 hours.   Her updated medication list for this problem includes:    Tylenol Extra Strength 500 Mg Tabs (Acetaminophen) .Marland Kitchen... As needed  Complete Medication List: 1)  Dulcolax Stool Softener 100 Mg Caps (Docusate sodium) .... As needed 2)  Glucosamine-chondroitin-msm 500-250-250 Mg Caps (Glucosamine-chondroitin-msm) .... Take 1 tablet by mouth once a day 3)  Tylenol Extra Strength 500 Mg Tabs (Acetaminophen) .... As needed 4)  Centrum Tabs (Multiple vitamins-minerals) .... Take 1 tablet by mouth once a day 5)  Metoprolol Tartrate 25 Mg Tabs (Metoprolol tartrate) .Marland Kitchen.. 1 tab as directed by physcian twice a day 6)  Warfarin Sodium 5 Mg Tabs (Warfarin sodium) .... One by mouth daily or as directed 7)  Namenda 10 Mg Tabs (Memantine hcl) .Marland Kitchen.. 1 by mouth two times a day 8)  Avelox 400 Mg Tabs (Moxifloxacin hcl) .Marland Kitchen.. 1 by mouth once daily x 5 for possible respiratory infection.  Patient Instructions: 1)  Illness with fever, weakness, cough - sounds ike a respiratory infection. In a mature woman we may not see as much fever or other usual signs of infection. But, the chagne in level of consciousness, the fever she has had  suggfest infection. Plan - Avelox 400mg  once daily x 5 days; robitussin DM 1 tsp every 6 hours as needed; hydration with fluid of choice; tylenol 500mg  every 6 hours as needed for fever and aches. Call for any worsening of symptoms.  Prescriptions: AVELOX 400 MG TABS (MOXIFLOXACIN HCL) 1 by mouth once daily x 5  for possible respiratory infection.  #5 x 0   Entered and Authorized by:   Jacques Navy MD   Signed by:   Jacques Navy MD on 02/09/2011   Method used:   Electronically to        [facility] [address] [phone]* (retail)       [address]       [place], [place]  [id number]       Ph: [phone] or [phone]       Fax: [phone]   RxID:   [id number]    Orders Added: 1)  Est. Patient Level III [40347]

## 2011-02-24 ENCOUNTER — Ambulatory Visit (INDEPENDENT_AMBULATORY_CARE_PROVIDER_SITE_OTHER): Payer: Medicare Other | Admitting: *Deleted

## 2011-02-24 DIAGNOSIS — Z7901 Long term (current) use of anticoagulants: Secondary | ICD-10-CM

## 2011-02-24 DIAGNOSIS — I4891 Unspecified atrial fibrillation: Secondary | ICD-10-CM

## 2011-02-24 DIAGNOSIS — I6350 Cerebral infarction due to unspecified occlusion or stenosis of unspecified cerebral artery: Secondary | ICD-10-CM

## 2011-02-24 LAB — POCT INR: INR: 1.9

## 2011-02-24 NOTE — Patient Instructions (Signed)
Take 1.5 tablets today, then resume same dosage 1 tablet daily except 1/2 tablet on Mondays, Wednesdays, and Saturdays.  Recheck in 3 weeks.

## 2011-02-25 ENCOUNTER — Ambulatory Visit (HOSPITAL_COMMUNITY): Payer: Medicare Other | Attending: Internal Medicine | Admitting: Radiology

## 2011-02-25 DIAGNOSIS — I4891 Unspecified atrial fibrillation: Secondary | ICD-10-CM

## 2011-02-25 DIAGNOSIS — J90 Pleural effusion, not elsewhere classified: Secondary | ICD-10-CM | POA: Insufficient documentation

## 2011-03-01 LAB — POCT I-STAT 4, (NA,K, GLUC, HGB,HCT)
Glucose, Bld: 92 mg/dL (ref 70–99)
HCT: 24 % — ABNORMAL LOW (ref 36.0–46.0)
Potassium: 4.5 mEq/L (ref 3.5–5.1)
Sodium: 143 mEq/L (ref 135–145)

## 2011-03-02 ENCOUNTER — Other Ambulatory Visit (HOSPITAL_COMMUNITY): Payer: Medicare Other

## 2011-03-02 ENCOUNTER — Telehealth: Payer: Self-pay | Admitting: Internal Medicine

## 2011-03-02 LAB — CBC
Hemoglobin: 13.8 g/dL (ref 12.0–15.0)
MCV: 95.3 fL (ref 78.0–100.0)
RBC: 4.24 MIL/uL (ref 3.87–5.11)
WBC: 5.8 10*3/uL (ref 4.0–10.5)

## 2011-03-02 LAB — URINALYSIS, ROUTINE W REFLEX MICROSCOPIC
Glucose, UA: NEGATIVE mg/dL
Hgb urine dipstick: NEGATIVE
Protein, ur: NEGATIVE mg/dL
Specific Gravity, Urine: 1.023 (ref 1.005–1.030)
pH: 6 (ref 5.0–8.0)

## 2011-03-02 LAB — URINE CULTURE

## 2011-03-02 LAB — URINE MICROSCOPIC-ADD ON

## 2011-03-02 LAB — POCT I-STAT, CHEM 8
Calcium, Ion: 1.02 mmol/L — ABNORMAL LOW (ref 1.12–1.32)
Chloride: 109 mEq/L (ref 96–112)
Creatinine, Ser: 1.4 mg/dL — ABNORMAL HIGH (ref 0.4–1.2)
Glucose, Bld: 110 mg/dL — ABNORMAL HIGH (ref 70–99)
Potassium: 4.2 mEq/L (ref 3.5–5.1)

## 2011-03-02 LAB — POCT CARDIAC MARKERS
CKMB, poc: 1.2 ng/mL (ref 1.0–8.0)
Troponin i, poc: <0.05 ng/mL (ref 0.00–0.09)

## 2011-03-02 LAB — DIFFERENTIAL
Eosinophils Absolute: 0.1 10*3/uL (ref 0.0–0.7)
Lymphs Abs: 1.4 10*3/uL (ref 0.7–4.0)
Monocytes Absolute: 0.5 10*3/uL (ref 0.1–1.0)
Monocytes Relative: 10 % (ref 3–12)
Neutro Abs: 3.7 10*3/uL (ref 1.7–7.7)
Neutrophils Relative %: 65 % (ref 43–77)

## 2011-03-02 NOTE — Telephone Encounter (Signed)
Called and spoke with grand-daughter. Reviewed eChart- '08 EF 55%, '11 EF 30%, now 25%.   Plan - continue beta-blocker; continue lasix; "rule of 2's:"            Encourage diet.            Call for problems: shortness of breath, increased weakness, etc.  I explained that she may be failing - a result of age and multiple co-morbidities.

## 2011-03-17 ENCOUNTER — Ambulatory Visit (INDEPENDENT_AMBULATORY_CARE_PROVIDER_SITE_OTHER): Payer: Medicare Other | Admitting: *Deleted

## 2011-03-17 DIAGNOSIS — I6350 Cerebral infarction due to unspecified occlusion or stenosis of unspecified cerebral artery: Secondary | ICD-10-CM

## 2011-03-17 DIAGNOSIS — I4891 Unspecified atrial fibrillation: Secondary | ICD-10-CM

## 2011-03-17 DIAGNOSIS — Z7901 Long term (current) use of anticoagulants: Secondary | ICD-10-CM

## 2011-03-26 ENCOUNTER — Other Ambulatory Visit: Payer: Self-pay | Admitting: *Deleted

## 2011-03-26 MED ORDER — WARFARIN SODIUM 5 MG PO TABS: 5.0000 mg | ORAL_TABLET | ORAL | Status: DC

## 2011-04-01 ENCOUNTER — Ambulatory Visit (INDEPENDENT_AMBULATORY_CARE_PROVIDER_SITE_OTHER): Payer: Medicare Other | Admitting: *Deleted

## 2011-04-01 DIAGNOSIS — I4891 Unspecified atrial fibrillation: Secondary | ICD-10-CM

## 2011-04-01 DIAGNOSIS — I6350 Cerebral infarction due to unspecified occlusion or stenosis of unspecified cerebral artery: Secondary | ICD-10-CM

## 2011-04-01 DIAGNOSIS — Z7901 Long term (current) use of anticoagulants: Secondary | ICD-10-CM

## 2011-04-01 LAB — POCT INR: INR: 3.1

## 2011-04-07 NOTE — Assessment & Plan Note (Signed)
[facility]                            CARDIOLOGY OFFICE NOTE   NAME:[name], [name]                      MRN:          [id number]  DATE:09/06/2007                            DOB:          1919/12/26    REFERRING PHYSICIAN:  Sheppard Penton. Stacie Acres, M.D.   PRIMARY CARE PHYSICIAN:  Quarry manager, [employer].   REASON FOR REFERRAL:  Evaluate the patient with abnormal EKG.   HISTORY OF PRESENT ILLNESS:  The patient is an 77 year old white female  without prior cardiac history.  She has had high blood pressure for a  few years.  She has been taking medications.  However, she was noted at  her GYN office to have a low blood pressure.  She might have felt a  little off at that time.  She was subsequently referred to Urgent Care  the next day after this low reading and was noted to have an abnormal  EKG with mild intraventricular conduction delay, poor anterior R wave  progression suggestive of old anteroseptal infarct.  She had multiple  premature atrial contractions.  She was referred to the emergency room  and actually managed for high blood pressure.  She was subsequently sent  to see [place] for follow up of the abnormal EKG.  The patient lives by  herself.  She does her activities of daily living.  She denies any chest  pressure, neck or arm discomfort.  She says she does not notice  palpitations and has had no presyncope or syncope.  She has occasionally  felt a little dizziness, but this is rare.  She does not describe any  orthostatic symptoms.  She does not keep her blood pressure at home, but  does not report that it is typically elevated in the doctor's office.  She has otherwise been feeling well.   PAST MEDICAL HISTORY:  1. Hypertension times three years.  2. Breast cancer.   PAST SURGICAL HISTORY:  Lumpectomy.  The patient also had radiation  times 18 one and a half years ago.   ALLERGIES:  None.   CURRENT MEDICATIONS:  1. Fosamax.  2.  Bisoprolol HCT 5/6.25.   SOCIAL HISTORY:  The patient is retired.  She is a widow.  She has one  daughter.  She has never smoked cigarettes and never drank alcohol.   FAMILY HISTORY:  Noncontributory for early coronary artery disease.  Mother died at age 18 of a myocardial infarction.   REVIEW OF SYSTEMS:  Positive for dizziness, cough, joint pains, negative  for other systems.   PHYSICAL EXAMINATION:  GENERAL:  The patient is in no acute distress.  VITAL SIGNS:  Blood pressure 160/90, heart rate 66 and slightly  irregular, weight 106 pounds.  HEENT:  Eyes unremarkable.  Pupils equal, round and reactive to light.  Fundi not visualized.  Oral mucosa unremarkable.  NECK:  No jugular venous distention at 45 degrees.  Carotid upstrokes  brisk and symmetric.  No bruits or thyromegaly.  LYMPHATICS.  No cervical, axillary or inguinal adenopathy.  LUNGS:  Clear to auscultation bilaterally.  BACK:  No costovertebral  angle tenderness.  CHEST:  Unremarkable.  HEART:  PMI not displaced or sustained.  S1, S2 within normal limits.  No S3, S4.  No clicks, rubs, murmurs.  ABDOMEN:  Flat, positive bowel sounds.  Normal in frequency and pitch.  No bruits, rebound, guarding.  No midline pulsatile mass.  No  hepatomegaly.  No splenomegaly.  SKIN:  No rashes, no nodules.  EXTREMITIES:  Pulses 2+ without clubbing or edema.  NEUROLOGICAL:  Grossly intact.   STUDIES:  EKG:  Sinus rhythm, rate 68, right axis deviation, QT  prolongation, borderline intraventricular conduction delay,  inferolateral T wave changes consistent with strain pattern, poor  anterior R wave progressions.  Cannot exclude an anteroseptal infarct.  PACs.   ASSESSMENT/PLAN:  1. Abnormal EKG.  The patient's EKG is suggestive of some other      repolarization changes.  She does not have any symptoms consistent      with an old anteroseptal infarct.  At some point in the future, I      will get an echocardiogram to look for regional  wall motion      abnormalities and in particular look for left ventricular      hypertrophy.  Of note, the patient does have PACs, but these are      asymptomatic, and so no further therapy is warranted.  2. Hypertension.  I am concerned that the patient has elevated blood      pressures.  I think with her ectopy it would be easy to misread      these as low blood pressures and wonder if this could have been      what was happening with the low blood pressure readings.  I have      instructed her to get a blood pressure cuff and to bring it back      here to make sure it is accurate.  She should then keep a blood      pressure diary.  I will titrate her medications as needed for blood      pressure control.   FOLLOWUP:  I will see her back in about four weeks.  She will come back  with blood pressure cuff for correlation before that.     Rollene Rotunda, MD, [facility]  Electronically Signed    JH/MedQ  DD: 09/06/2007  DT: 09/07/2007  Job #: [id number]   cc:   Sheppard Penton. Stacie Acres, M.D.

## 2011-04-07 NOTE — Assessment & Plan Note (Signed)
[facility]                            CARDIOLOGY OFFICE NOTE   [name], [name]                      MRN:          [id number]  DATE:01/02/2008                            DOB:          12/09/1919    PRIMARY CARE PHYSICIAN:  Barbette Hair. Artist Pais, DO.   REASON FOR PRESENTATION:  Evaluate the patient with hypertension and a  previous cerebrovascular accident.   HISTORY OF PRESENT ILLNESS:  The patient presents for follow up of the  above.  She has established with Dr. Artist Pais as her primary care.  She is  having her blood pressure managed and recently had Norvasc 2.5 mg added  to her regimen.  She brings a blood pressure diary and her blood  pressures are much better than they were previously.  They are more  consistently in the 120-130 range.  She is bothered by urinary  incontinence.  This happens at night and during the day.  Her daughter  asked whether the hydrochlorothiazide could be adding to this at all.  She has seen somebody and has failed medical management of this.  She  has had frequent UAs.  She is continuing to have follow up of this.   She has had no new symptoms.  She had residual clumsiness from her  CVA.  She is not describing any chest pain, neck or arm discomfort.  She  is not having any palpitations, presyncope or syncope.  She is having no  PND or orthopnea.   PAST MEDICAL HISTORY:  1. Transient ischemic attack/CVA.  2. Hypertension.  3. Urinary incontinence.  4. Dyslipidemia.  5. Breast cancer status post radiation and lumpectomy of the breast.  6. Peripheral vascular disease (ABI was 0.53 on the left and 0.79 on      the right with a short segment occlusion of the left SFA).   ALLERGIES:  NONE.   MEDICATIONS:  1. Bisoprolol/HCT 5/6.25 daily.  2. Fosamax.  3. Cipro.  4. Simvastatin 40 mg daily.  5. Aspirin 81 mg daily.  6. Amlodipine 2.5 mg daily.   REVIEW OF SYSTEMS:  As stated in the HPI, otherwise negative for other  systems.   PHYSICAL EXAMINATION:  GENERAL:  The patient is in no distress.  VITAL SIGNS:  Blood pressure 138/69, heart rate 58 and regular, weight  106 pounds, body mass index 19.  HEENT:  Eyelids are unremarkable.  Pupils are equal, round and reactive  to light.  Fundi not visualized.  Oral mucosa unremarkable.  NECK:  No jugular venous distention.  Waveform within normal limits.  Carotid upstroke brisk and symmetric.  No bruits or thyromegaly.  LYMPHATICS:  No adenopathy.  LUNGS:  Clear to auscultation bilaterally.  BACK:  No costovertebral angle tenderness.  CHEST:  Unremarkable.  HEART:  PMI not displaced or sustained, S1-S2 within normal limits, no  S3-S4, no clicks, rubs, murmurs.  ABDOMEN:  Flat, positive bowel sounds.  Normal in frequency and pitch.  No bruits, rebound, guarding.  No midline pulsatile mass, organomegaly.  SKIN:  No rashes.  EXTREMITIES:  Upper pulse 2+, decreased bilateral lower  extremity  pulses, no cyanosis or clubbing.  NEURO:  Grossly intact.   ASSESSMENT/PLAN:  1. Hypertension.  Her blood pressure is being treated by Dr. Artist Pais and      she had amlodipine added.  I am going to get rid of the HCTZ as she      is having such as significant problem with urinary incontinence.  I      doubt that this will play a significant role, but it is worth a      trial of bisoprolol alone.  She will be keeping an eye on her blood      pressure.  If after 2 weeks she is having no improvement in her      incontinence, she will go back to the bisoprolol/HCTZ.  2. Dyslipidemia.  She is on the simvastatin for primary prevention      having had a CVA.  I will defer followup to Dr. Artist Pais.  3. Peripheral vascular disease.  I will get her an appointment with      Dr. Excell Seltzer as she is having some symptomatic left leg tiredness      with ambulation and this is somewhat limiting, and she has the      abnormal ABIs below.   FOLLOW UP:  I will see her back in about 6 months and then  perhaps  yearly thereafter.     Rollene Rotunda, MD, [facility]  Electronically Signed    JH/MedQ  DD: 01/02/2008  DT: 01/03/2008  Job #: [id number]   cc:   Barbette Hair. Artist Pais, DO

## 2011-04-07 NOTE — H&P (Signed)
NAMESTELLAROSE, CERNY NO.:  [id number]   MEDICAL RECORD NO.:  [id number]           PATIENT TYPE:   LOCATION:                               FACILITY:  [facility]   PHYSICIAN:  Wayne A. Sheffield Slider, M.D.    DATE OF BIRTH:  1919-12-23   DATE OF ADMISSION:  11/10/2007  DATE OF DISCHARGE:                              HISTORY & PHYSICAL   PRIMARY CARE PHYSICIAN:  [facility].   CARDIOLOGIST:  Dr. Rollene Rotunda, [facility].   CHIEF COMPLAINT:  Left-sided weakness six days ago, concerning for a  TIA.   HISTORY OF PRESENT ILLNESS:  Ms. [name] is an 77 year old white female  with a history of hypertension, who presents to the [facility] Emergency Department after an MRI/MRA reading, significant for  an acute infarction of the right anterior MCA division.  She reports  that she was told by neighbors last Thursday on November 03, 2007, that  she had a left facial droop and had some transient left-sided weakness  which lasted for approximately 30 minutes.  They were concerned for a  TIA and went to [facility], who ordered an MRI/MRA for November 09, 2007.  She has not had an episode since.  She denies any other  symptoms of focal weakness.  Denies headaches, dizziness, changes in  vision, trouble swallowing or syncopal episodes.  She is very active,  self-sufficient and independent.   PAST MEDICAL HISTORY:  1. Hypertension.  2. History of breast cancer, status post radiation and lumpectomy of      the left breast.   PAST SURGICAL HISTORY:  Left breast lumpectomy.   CURRENT HOME MEDICATIONS:  1. Fosamax 70 mg p.o. every week.  2. Bisoprolol/hydrochlorothiazide 5/6.25 mg p.o. daily.   ALLERGIES:  No known drug allergies.   FAMILY HISTORY:  Mother had breast cancer and heart disease.   SOCIAL HISTORY:  [place] alone [place] acres.  Takes care of horses and  her garden.  Widowed in 2007.  Has one daughter.  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  No tobacco use.  No alcohol abuse.   REVIEW OF SYSTEMS:  No fevers, no chills, no weight loss, no headache,  no dizziness, no vision changes, no dysphagia.  No chest pain or  shortness of breath.  No nausea and vomiting.  No abdominal pain, no  diarrhea, no dysuria, no hematuria, no bloody stools.  No rashes.  No  weakness acutely.   PHYSICAL EXAMINATION:  VITAL SIGNS:  Temperature 97 degrees, pulse 60's,  blood pressure 124/61, respirations 22, 96% saturation on room air.  GENERAL:  A pleasant alert cooperative, elderly female, in no apparent  distress.  HEENT:  Atraumatic.  Extraocular movements intact.  Pupils equal, round,  reactive to light and accommodation.  No facial droop.  Moist mucous  membranes.  Has a symmetrical smile.  CARDIOVASCULAR:  A regular rate and rhythm.  No murmurs, rubs or  gallops.  Distinct S1 and S2.  LUNGS:  Clear to auscultation bilaterally.  ABDOMEN:  Soft, nontender, non-distended.  Positive bowel sounds.  No  hepatosplenomegaly.  EXTREMITIES:  No edema.  NEUROLOGIC:  Cranial nerves II-XII  grossly intact.  No focal deficit.  Normal finger-to-nose exam.  Negative Romberg.  With 5/5 strength in the  upper extremities bilaterally, 4/5 strength in the left lower extremity  and 5/5 strength in the right lower extremity.  There are 2+ deep tendon  reflexes.  SKIN:  No rashes or lesions.  PSYCHIATRIC:  Alert and oriented x3.   The MRI/MRA of the head on November 09, 2007, showed acute infarction at  the right anterior MCA division with small foci, suspicious for emboli.  Moderate to severe stenosis of the right MCA and temporal/anterior  sylvian branch, with mildly attenuated flow.   White blood cell count was 5.4, hemoglobin 11.1, platelets 215.  Sodium  137, potassium 4.3, chloride 103, bicarbonate 29, BUN 27, creatinine  1.13, glucose 106.  PT 13.2, INR 1, PTT 28.   ASSESSMENT:  This is an 77 year old white female with acute  infarction  on MRI/MRA, but clinically without deficits or symptoms.   PROBLEM/PLAN:  1. Transient ischemic attack/cerebrovascular accident:  The history      clinically is consistent with a transient ischemic attack.  The      MRI/MRA consistent with a cerebrovascular accident.  Will perform a      thorough for an embolic source.  Will obtain a 2-D echocardiogram      and carotid Dopplers.  Will restart on higher dose aspirin 325 mg      daily.  Risk stratification with fasting lipid panel.  Will      consider switching antihypertensive treatment to ACE inhibitor for      protective purposes, but will discuss this with the team.  Likely      will not change and leave to the primary care team.  Will consult      PT for left lower extremity weakness which has been chronic.  2. Hypertension:  This has been stable.  Will continue on the home      regimen with bisoprolol, hydrochlorothiazide 5/6.25 mg daily for      now.  3. Prophylaxis:  Will instill deep venous thrombosis prophylaxis with      Lovenox 400 mg subcu daily.  4. Fever/Electrolytes/Nutrition:  Will place on a heart-healthy diet.      Will recheck her BMET and CBC in the a.m.   DISPOSITION:  Will admit to [facility] inpatient  status on telemetry and will await her studies tomorrow.      Marisue Ivan, MD  Electronically Signed      Arnette Norris. Sheffield Slider, M.D.  Electronically Signed    KL/MEDQ  D:  11/10/2007  T:  11/10/2007  Job:  [id number]

## 2011-04-07 NOTE — Assessment & Plan Note (Signed)
[facility]                                 ON-CALL NOTE   [name], [name]                      MRN:          [id number]  DATE:06/01/2009                            DOB:          23-Nov-1920    PRIMARY CARDIOLOGIST:  Rollene Rotunda, MD, [facility]   PROBLEM:  I received a call regarding [name] earlier today, regarding  rapid heartbeat.  Upon contacting the patient herself a short while ago,  however, she informs me that the initial concern by her granddaughter,  who placed the call, was that her blood pressure was high.  When  querying the patient, however, she reported systolic readings of 106-  124.  Moreover, she denied any chest pain, shortness of breath,  tachypalpitations, or presyncope.  The patient had taken all of her  usual medications.   PLAN:  I informed [name], who has a followup appointment with Dr.  Antoine Poche this Monday, June 03, 2009, to contact me in the event that she  develops any symptoms.  She is to otherwise continue her current  medication regimen, and to follow up with Dr. Antoine Poche on Monday, as  scheduled.  I also assured her that her current blood pressure readings  are well within normal limits.  In the absence of any current symptoms,  no other recommendations were made.     Gene Serpe, PA-C  Electronically Signed    GS/MedQ  DD: 06/01/2009  DT: 06/02/2009  Job #: [id number]

## 2011-04-07 NOTE — Discharge Summary (Signed)
[name], [name] NO.:  [id number]   MEDICAL RECORD NO.:  [id number]          PATIENT TYPE:  INP   LOCATION:  1616                         FACILITY:  [facility]   PHYSICIAN:  Rosalyn Gess. Norins, MD  DATE OF BIRTH:  [birth year]   DATE OF ADMISSION:  08/13/2008  DATE OF DISCHARGE:                               DISCHARGE SUMMARY   ADDENDUM:  1. Additional medication orders include Lovenox 40 mg subcutaneous      daily until August 26, 2008.  2. Starting August 26, 2008, she will be treated with aspirin 325 mg      for 4 weeks.  3. The patient is to take iron 325 mg 3 times daily for 2 weeks and      then discontinue.  4. The patient may do weightbearing with a rolling walker.      Rosalyn Gess Norins, MD  Electronically Signed     MEN/MEDQ  D:  08/17/2008  T:  08/17/2008  Job:  [id number]
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
## 2011-04-07 NOTE — Assessment & Plan Note (Signed)
[facility]                            CARDIOLOGY OFFICE NOTE   Archita, Lomeli JAISHA VILLACRES                      MRN:          [id number]  DATE:03/07/2008                            DOB:          [birth year]    PRIMARY:  Dr. Dondra Spry.   REASON FOR PRESENTATION:  Evaluate patient with hypertension and  peripheral vascular disease.   HISTORY OF PRESENT ILLNESS:  The patient is 77 years old.  She presents  for follow-up of the above.  She had been following closely with Dr.  Artist Pais.  Her blood pressure had been well controlled.  However, she was  accidentally taking her bisoprolol 5 mg plus bisoprolol HCT 5/6.25.  Since she stopped the combination therapy, her blood pressure has been  up a little bit in the 150s.  She did see Dr. Excell Seltzer since I last saw  her.  He suggested conservative management and did add Pletal.  However,  she had no improvement in her symptoms after several weeks and stopped  this.  She is denying any chest pressure, neck or arm discomfort.  She  is not having any shortness of breath.  She does get pain or tiredness  in her legs with walking.  However, she is simply trying to increase her  walking to improve this.  In fact, she has a pedometer and tries to  10,000 steps a day.   PAST MEDICAL HISTORY:  1. Transient ischemic attack/CVA.  2. Hypertension.  3. Urinary incontinence.  4. Dyslipidemia.  5. Breast cancer, status post radiation and lumpectomy of the breast.  6. Peripheral vascular disease (most recent ABI 0.79 on the right and      0.53 on the left).   ALLERGIES:  NONE.   MEDICATIONS:  1. Fosamax  2. Aspirin 81 mg daily.  3. Amlodipine 2.5 mg daily.  4. Bisoprolol 5 mg daily.  5. Crestor 10 mg daily.  6. Calcium.  7. Ramipril 1.25 mg daily.  8. Enablex 7.5 mg daily.   REVIEW OF SYSTEMS:  As stated in the HPI, and otherwise negative for  other systems.   PHYSICAL EXAMINATION:  GENERAL:  The patient is in no acute  distress.  VITAL SIGNS:  Blood pressure 173/70 with a heart rate of 59 (repeat  blood pressure was 154/72).  HEENT:  Eyes unremarkable.  Pupils equal,  round and reactive to light. Fundi not visualized.  NECK:  No jugulovenous distention, carotid upstroke brisk and symmetric,  no bruits, no thyromegaly.  LYMPHATICS:  No adenopathy.  LUNGS:  Clear to auscultation bilaterally.  HEART:  PMI not displaced or sustained, S1-S2 within normal limits, no  S3, no S4, no clicks, no rubs, no murmurs.  ABDOMEN:  Flat, positive bowel sounds, normal in frequency and pitch, no  bruits, no rebound, no guarding, no midline pulsatile mass.  EXTREMITIES:  2+ upper pulses, absent dorsalis pedis and posterior  tibialis bilaterally.  NEURO:  Grossly intact.   ASSESSMENT/PLAN:  1. Hypertension.  Blood pressure is still slightly elevated.  We      discussed strategies.  At this  point, I will go up on the ramipril      to 1.25 twice a day.  Her daughter will keep a blood pressure diary      and let us know how she is responding to this.  She can confirm      with Dr. Artist Pais or myself with these readings.  2. Peripheral vascular disease.  Again, she is going to stay off the      Pletal and try to treat this conservatively with ambulation.  Dr.      Excell Seltzer is not planning further evaluation.  3. Dyslipidemia per Dr. Artist Pais.  The goal should be an LDL less 100 and      HDL greater than 50.   FOLLOWUP:  Will see her back in 6 months or sooner if needed.     Rollene Rotunda, MD, [facility]  Electronically Signed    JH/MedQ  DD: 03/07/2008  DT: 03/07/2008  Job #: [id number]

## 2011-04-07 NOTE — Progress Notes (Signed)
[facility]                        PERIPHERAL VASCULAR OFFICE NOTE   VERONCIA, JEZEK                      MRN:          [id number]  DATE:01/12/2008                            DOB:          11-25-1919    REFERRING PHYSICIAN:  Rollene Rotunda, MD, [facility]   PRIMARY CARE PHYSICIAN:  Thomos Lemons.   CHIEF COMPLAINT:  Left leg pain.   HISTORY OF PRESENT ILLNESS:  Ms. Mergenthaler is a delightful 77 year old woman  with peripheral arterial disease, hypertension, and prior stroke.  She  complains of several years of  left calf pain with ambulation that feels  like an ache in the calf.  She has some right calf pain as well but this  is much less severe than on the left side.  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  She can walk back and forth to  her mailbox which is a distance of approximately 100-200 yards.  She  does experience pain with his degree of walking but is able to get back  to her house without stopping.  She denies pain in the hips or thighs.  She has no rest pain.  She has had no ulcerations or skin problems  involving the legs.   Ms. Linz had a TIA back in December 2008 that left her with some  residual clumsiness and mild cognitive impairment, but overall she seems  to be doing well at this time.  She has no other specific complaints.   PAST MEDICAL HISTORY:  1. TIA as described  2. Essential hypertension.  3. Peripheral arterial disease with abnormal ABIs and left calf      claudication.  4. Dyslipidemia.  5. History of breast cancer status post radiation and lumpectomy.  6. Urinary incontinence.   MEDICATIONS:  Fosamax 70 mg weekly, simvastatin 40 mg daily, aspirin 81  mg daily, bisoprolol 5 mg daily, amlodipine 2.5 mg daily.   ALLERGIES:  NKDA.   SOCIAL HISTORY:  The patient [place] farm.  She is a nonsmoker  and does not drink alcohol.  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   FAMILY HISTORY:  Her father died of a stroke.  There is no history of  peripheral arterial disease in the family.   REVIEW OF SYSTEMS:  Pertinent positives included urinary incontinence  and mild cognitive impairment.  All other systems were reviewed and are  negative except as detailed above.   PHYSICAL EXAMINATION:  The patient is a pleasant elderly woman in no  acute distress.  Weight is 106, blood pressure 160/80 on the right,  158/84 on the left, heart rate 58, respiratory rate 16.  HEENT:  Normal.  NECK:  Normal carotid upstrokes without bruits.  Jugular venous pressure  is normal.  No thyromegaly or thyroid nodules.  LUNGS:  Clear to auscultation bilaterally.  HEART:  The apex is discrete and nondisplaced, heart is regular rate and  rhythm without murmurs or gallops.  CHEST is normal excursion without deformity.  BACK:  No CVA tenderness.  ABDOMEN:  Soft, nontender, no organomegaly.  No bruits.  Bowel sounds  are present.  EXTREMITIES:  No clubbing, cyanosis or edema.  Peripheral  pulses are 2+ femoral bilaterally.  On the right the dorsalis pedis is  2+, the posterior tibial is trace, popliteal is 2+. On the left I am  unable to palpate a popliteal, dorsalis pedis or posterior tibial.  SKIN:  Warm and dry without rash.  The feet are cool.  NEUROLOGIC:  Cranial nerves II-XII are intact.  Strength is 5/5 and  equal in the arms and legs.   STUDIES REVIEWED:  Lower extremity duplex from January 7 showed an ABI  of 53% on the left and 79% on the right. In the right lower extremity  there is a focal stenosis in the distal SFA.  On the left the SFA is  occluded and reconstitutes in the popliteal artery over a distance of  approximately 12 cm.  There is also a focal stenosis in the popliteal  artery.   ASSESSMENT:  Ms. Standish is an 77 year old woman with lower extremity  peripheral arterial disease that involves total occlusion of the left  superficial  femoral artery.  From a symptomatic standpoint Ms. Miano has  lifestyle-limiting claudication but does not have any evidence of  critical limb ischemia or progressive symptoms.  She really has stable  claudication over a long time period.   For treatment I would favor a conservative approach in this elderly  woman.  I think that medical therapy is warranted and if she develops  progressive symptoms, we could certainly reserve an attempt at  revascularization for that circumstance.  Otherwise her symptoms have  been quite stable and she is able to do routine activities without much  trouble.  I am going to give her a trial of Pletal at a low dose to see  how she responds to this over the next several months.  Otherwise she  will continue on her current medical therapy without changes.     Veverly Fells. Excell Seltzer, MD  Electronically Signed    MDC/MedQ  DD: 01/18/2008  DT: 01/19/2008  Job #: [id number]   cc:   Rollene Rotunda, MD, [facility]. Artist Pais, DO

## 2011-04-07 NOTE — Discharge Summary (Signed)
NAMEGERRY, [name] NO.:  [id number]   MEDICAL RECORD NO.:  [id number]          PATIENT TYPE:  INP   LOCATION:  2022                         FACILITY:  [facility]   PHYSICIAN:  Pearlean Brownie, M.D.DATE OF BIRTH:  1920/03/07   DATE OF ADMISSION:  11/09/2007  DATE OF DISCHARGE:  11/10/2007                               DISCHARGE SUMMARY   PRIMARY CARE PHYSICIAN:  Patient was unassigned but sees Dr. Antoine Poche  for her cardiology.   DISCHARGE DIAGNOSES:  1. Transient ischemic attack/cerebrovascular accident.  2. Hypertension.  3. Osteoporosis.   DISCHARGE MEDICATIONS:  1. Bisoprolol/HCTZ 5/6.25 mg p.o. daily.  2. Aspirin 325 mg p.o. daily.  3. Fosamax 70 mg p.o. weekly.  4. Zocor 40 mg p.o. at bedtime.   CONSULTS:  None.   PROCEDURES:  Patient had a 2D echo that showed an ejection fraction of  55-60%.  No wall thickness.  No wall motion abnormalities.  No source of  embolism.  That echo was performed on November 10, 2007.  The patient  also had a carotid Doppler performed on November 10, 2007 that showed  vertebral artery flow antegrade bilaterally with no stenosis of the  right or left ICA.   LABS:  Upon admission, the patient had a MRI/MRA previously prior to  admission on December 17 that showed acute infarct at the right anterior  MCA division with small foci suspicious for emboli.  It also showed  moderate-to-severe stenosis of the right MCA anterotemporal/anterior  sylvian branch with mildly attenuated flow.   Patient had a sodium level of 137, potassium 4.3, glucose 106, INR 1.  White blood cells 5.4, hemoglobin 11.1, creatinine 1.13.  During her  admission, she had a fasting lipid panel that showed a cholesterol of  202, triglycerides of 68, HDL 49, LDL 139.  Upon discharge, the patient  had a white blood cell count of 5.5, hemoglobin 11.5, platelets 196.  Sodium 142, potassium 4.1, chloride 105, bicarb 27, BUN 27, creatinine  1.18, and glucose  107.   BRIEF HOSPITAL COURSE:  This is an 77 year old white female that was  admitted for a right anterior MCA infarct found on MRI/MRA.   PROBLEM LIST:  1. TIA/CVA:  Patient was asymptomatic with no obvious focal deficits      upon admission.  She admitted to having symptoms that lasted a half      hour six days prior to admission.  She was admitted for further      workup to find the source of the possible emboli.  Her MRI/MRA, as      stated above, warranted admission and further workup.  She      underwent a 2D echo on the day of admission with results as stated      above.  She also underwent carotid Dopplers, as stated above.  She      was started on high dose aspirin and placed on DVT prophylaxis with      Lovenox.  She underwent risk stratification and was found to have      an elevated LDL and cholesterol, so  she was placed on Zocor 40 mg      daily.  She was left on her hypertensive medication but was      discussed with recommendations to switch to an ACE but will leave      this to her primary care physician, for this may have protective      qualities after a stroke.  2. Hypertension:  Her blood pressures were stable throughout her      hospitalization.  Again, she was continued on her home dose of      bisoprolol/hydrochlorothiazide, but it is recommended that she will      be switched to an ACE inhibitor, for this may be protective after a      stroke event.  3. Osteoporosis:  Continue home dose.  4. Hyperlipidemia:  She was started on Zocor 40 mg daily.   DISCHARGE INSTRUCTIONS:  She used to be on a heart-healthy diet.  She  has no restrictions to activity.  She needs to call her primary doctor  and return to the ED if she has any other focal signs of TIA or stroke.  She needs to follow up with her cardiologist and also identify and  obtain a new primary care physician.   FOLLOW-UP APPOINTMENTS:  She will call and make a follow-up appointment  with her cardiologist,  and she will set up a new primary care physician.   CONDITION ON DISCHARGE:  She was in stable condition.      [name] Ivan, MD  Electronically Signed      Pearlean Brownie, M.D.  Electronically Signed    KL/MEDQ  D:  11/11/2007  T:  11/12/2007  Job:  [id number]   cc:   Rollene Rotunda, MD, [facility]

## 2011-04-07 NOTE — Assessment & Plan Note (Signed)
OFFICE VISIT   [name], [name]  DOB:  05/21/1920                                       02/04/2010  EAVWU#:[id number]   The patient returned to the office today complaining of symptoms of  right lower extremity having previously undergone laser ablation of her  left great saphenous vein in October 2008 for severe venous  insufficiency and history of stasis ulcers in the left leg.  The left  leg has done extremely well since that procedure with decreased edema,  no recurrent ulcers, and decreased pain.  She states that she noted a  little lump down near the ankle on the right side that has had some  swelling and discomfort.  She has had no stasis ulcers, bleeding, or  bulging varicosities, and does not have the pain level she had on the  left side.  She occasionally wears elastic compression stockings, but  has no history of DVT in the right leg although had superficial  thrombophlebitis many years.   CHRONIC MEDICAL PROBLEMS:  1. COPD with asthmatic bronchitis.  2. Hypertension.  3. Diverticulosis.  4. Osteoarthritis.  5. Anemia.   FAMILY HISTORY:  Positive for venous disease in her mother.   SOCIAL HISTORY:  She is widowed, has 4 children, is a retired Armed forces operational officer, does not use tobacco or alcohol.   REVIEW OF SYSTEMS:  She denies any chest pain, dyspnea on exertion.  Does have chronic bronchitis and wheezing.  No GI or GU symptoms at this  time.  Does have diffuse joint pain.   PHYSICAL EXAM:  Blood pressure 186/75, heart rate 64, temperature 98.  General:  She is an elderly female in no apparent distress.  Alert and  oriented x3.  HEENT:  EOMS intact, conjunctivae normal.  Chest:  Relatively clear with some bilateral rhonchi.  Cardiovascular exam:  Regular rhythm with no murmurs.  Carotid pulses 3+, no bruits.  Abdomen:  Soft, nontender, with no masses.  Musculoskeletal exam:  Free of major  deformities.  Neurologic exam:  Normal.  Skin:   Free of rashes.  There  is hyperpigmentation on the left side with decreased edema from previous  studies.  No bulging varicosities.  The right leg has no  hyperpigmentation, no bulging varicosities, with 1 to 2+ edema distally.  There is no calf tenderness.   Today, I ordered a venous duplex exams which I have reviewed and  interpreted.  There is no evidence of reflux in the right great  saphenous vein and there is no evidence of deep venous obstruction on  the right side.   I have reassured her regarding these findings.  She does have a 3+  dorsalis pedis pulse with excellent circulation.  She will wear elastic  compression stockings and return to see [place] on a p.r.n. basis.     Quita Skye Hart [place], M.D.  Electronically Signed   JDL/MEDQ  D:  02/04/2010  T:  02/05/2010  Job:  [id number]
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
## 2011-04-07 NOTE — Assessment & Plan Note (Signed)
[facility]                            CARDIOLOGY OFFICE NOTE   NAME:[name], [name] AULT                      MRN:          [id number]  DATE:11/07/2007                            DOB:          01/09/1920    IDENTIFICATION:  [name] comes in as an add-on today.  She is usually  followed by Rollene Rotunda, MD.  Her daughter brings her because they  are concerned she may have had a TIA.  She has a history of hypertension  and breast cancer in the past.   Thursday of last week, she was at home and her alarm went off.  She  could not get it to go to silent and the alarm people came.  They  noticed that she had some confusion, that the left side of her mouth  question drooped.  They fixed the alarm, they came back after a couple  of hours and she seemed to be okay.  The patient does not remember any  of that as being a problem.  She talked to her daughter the following  day and her speech was normal.   Otherwise, she has been feeling okay.  No shortness of breath.  No chest  pain.  Again, no weakness focally.   CURRENT MEDICINE:  Fosamax and bisoprolol 5/6.25 daily.   PHYSICAL EXAMINATION:  VITAL SIGNS:  Blood pressure 129/59, pulse 59 and  regular, weight 107.  GENERAL APPEARANCE:  The patient is in no acute distress.  HEENT:  Normocephalic and atraumatic.  EOMI.  PERRL.  Moist mucous  membranes.  Throat clear.  NECK:  JVP is normal.  No audible bruits.  LUNGS:  Clear to auscultation without rales or wheezes.  CARDIOVASCULAR:  Regular rate and rhythm, no murmurs.  ABDOMEN:  Benign.  EXTREMITIES:  There are 2+ pulses, no edema.  NEUROLOGIC:  Cranial nerves II-XII intact.  Moving all extremities with  5/5 strength in the upper extremities.  Some 4+/5 on her lower leg  flexion, otherwise 5+.  Gait is a little unsteady but the daughter says  it is like this, but maybe a little worse today.  Romberg is negative.  Rapid alternating movements normal.   Finger-to-nose normal.   A 12-lead EKG not done.   IMPRESSION:  1. Spell.  It was only witnessed by the alarm company employees.  She      may have had some weakness on the left side of her face with some      slurring of speech.  Patient does not remember and no one else      observed it.  It has resolved.  I do not see any focal neurologic      deficits.  Still, I would treat her with aspirin 81 mg daily and      will set her up for an echocardiogram  and an MRI/MRA of her head      and neck.  She will follow up with Dr.      Antoine Poche in a couple of weeks.  If her symptoms recur, of course,  she should go to [employer].     Pricilla Riffle, MD, [facility]  Electronically Signed    PVR/MedQ  DD: 11/07/2007  DT: 11/08/2007  Job #: [id number]   cc:   Rollene Rotunda, MD, [facility]

## 2011-04-07 NOTE — Assessment & Plan Note (Signed)
[facility]                            CARDIOLOGY OFFICE NOTE   NAME:[name]                      MRN:          [id number]  DATE:11/30/2007                            DOB:          1920/10/07    This is a patient of Dr. Jenene Slicker and will be a new patient of Dr. Artist Pais  to be established this Monday.   This is an 77 year old widowed white female patient who presented to  [facility] after an MRI/MRA reading at our office  significant for an acute infarction of the right anterior MCA division.  She was told that she had left facial droop and left-sided weakness by  one of her neighbors, but she really did not notice any changes.  Since  then, she has had no residual.  She was hospitalized overnight.  A 2-D  echo showed ejection fraction of 55-60%, no wall thickness.  No wall  motion abnormalities.  No source of emboli.  Carotid Dopplers showed the  vertebral artery flow antegrade bilaterally with no stenosis of the  right or left ICA.  The patient was placed on aspirin and sent home  today.  She mentioned to her daughter that she has been having left calf  pain and asked what a clot felt like.  Her daughter called our office  and brought her in.  She says her left calf has actually started hurting  back in the fall when she tries to walk in it stops when she sits down.  She has no rest pain.  She has no edema or erythema.  She just noticed  in the fall when she walks and it has not gotten any worse or better.   CURRENT MEDICATIONS:  1. Bisoprolol/hydrochlorothiazide 5/6.25 mg daily.  2. Fosamax daily.  3. Cipro 250 mg b.i.d. for 7 days.  4. Simvastatin 40 mg daily.  5. Aspirin 81 mg daily.   PAST MEDICAL HISTORY:  Significant for  1. Hypertension.  2. History of breast cancer status post lumpectomy and radiation of      the left breast.   PHYSICAL EXAMINATION:  GENERAL APPEARANCE:  This is a pleasant 86-year-  old  white female in no acute distress.  VITAL SIGNS:  Blood pressure 144/67, pulse 65, weight 105.  NECK:  Without JVD, HJR, bruit or thyroid enlargement.  LUNGS:  Clear anterior, posterior and lateral.  HEART:  Regular rate and rhythm at 70 beats per minute.  Normal S1-S2.  No murmur, rub, bruit, thrill or heave noted.  ABDOMEN:  Soft without organomegaly, masses, lesions or abnormal  tenderness.  She has good femoral pulses bilaterally without bruit.  LOWER EXTREMITIES:  She has no cyanosis, clubbing or edema.  She has no  pain on palpating her calf.  She has good dorsalis pedis and posterior  tibial pulse on the right, but I cannot palpate her dorsalis pedis or  posterior tibial pulse on the left.   IMPRESSION:  1. Left calf pain with exercise, rule out arterial claudication.  2. Acute infarct of the right anterior middle cerebral artery  on      November 10, 2007, very little residual.  3. Hypertension.  4. History of breast cancer status post lumpectomy and radiation.   PLAN:  At this time, we will do both venous and arterial Dopplers to  rule out DVT which is most unlikely, but also rule out arterial  claudication.  She will then follow up with Dr. Antoine Poche.      Jacolyn Reedy, PA-C  Electronically Signed      Veverly Fells. Excell Seltzer, MD  Electronically Signed   ML/MedQ  DD: 11/30/2007  DT: 11/30/2007  Job #: [id number]   cc:   Barbette Hair. Artist Pais, DO

## 2011-04-07 NOTE — Assessment & Plan Note (Signed)
[facility]                            CARDIOLOGY OFFICE NOTE   NAME:[name], [name] GERNERT                      MRN:          [id number]  DATE:10/10/2007                            DOB:          Nov 05, 1920    PRIMARY:  [facility] [address].   REASON FOR PRESENTATION:  Evaluate patient with hypertension.   HISTORY OF PRESENT ILLNESS:  The patient follows for evaluation of high  blood pressure and documented low blood pressure recently while at her  gyn's office.  We did not make any medication changes at the last visit  but instructed her to keep a blood pressure diary.  Unfortunately, she  has only done this 3 times over the last few days.  She has blood  pressures systolics typically in the 150s.  She says she has had none of  the dizzy episodes that she was having at her gyn's office.  She is not  having any presyncope or syncope.  She has not been having any chest  discomfort, neck discomfort, or arm discomfort.  She is having no  shortness of breath.   PAST MEDICAL HISTORY:  1. Hypertension x3 years.  2. Breast cancer.  3. Lumpectomy.  4. Radiation 1-1/2 years ago.   ALLERGIES:  None.   MEDICATIONS:  1. Fosamax.  2. Bisoprolol HCT 5/6.25 daily.   REVIEW OF SYSTEMS:  As stated in the HPI and otherwise negative for  other systems.   PHYSICAL EXAMINATION:  The patient is in no distress.  Blood pressure 158/82, heart rate 62 and regular, weight 103 pounds,  body mass index 18.5.  NECK:  No jugular venous distention at 45 degrees, carotid upstroke  brisk and symmetric, no bruits, no thyromegaly.  LYMPHATICS:  No cervical, axillary, or inguinal adenopathy.  LUNGS:  Clear to auscultation bilaterally.  BACK:  No costovertebral angle tenderness.  CHEST:  Unremarkable.  HEART:  PMI not displaced or sustained.  S1 and S2 within normal limits.  No S3, no S4.  No clicks, no rubs, no murmurs.  ABDOMEN:  Flat, positive bowel sounds normal in  frequency and pitch.  No  bruits, no rebound, no guarding, no midline pulsatile mass, no  organomegaly.  SKIN:  No rashes, no nodules.  EXTREMITIES:  2+ pulses upper, 1+ dorsalis pedis and posterior tibialis  bilaterally.  NEURO:  Grossly intact.   ASSESSMENT AND PLAN:  1. Hypertension.  This is the patient's predominant problem.      Unfortunately, I do not have a real representation of her blood      pressures, and I instructed her to keep these more regularly.  I am      reluctant to make a change to her medications based on the data I      have in this elderly, small woman.  2. Dizziness.  The patient is no longer having this.  She is not      feeling any palpitations.  No further therapy is warranted.  3. Followup.  We will see her back in about 4 months for evaluation of  the blood pressure and will encourage her to establish with primary      care doctor, as she has not been seeing anybody routinely since her      primary care doctor moved.  I encouraged her to look at [facility] where she thinks she will stay for her primary      care.     Rollene Rotunda, MD, [facility]  Electronically Signed    JH/MedQ  DD: 10/10/2007  DT: 10/10/2007  Job #: [id number]   cc:   Rondall Allegra, [facility], [address]
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
## 2011-04-07 NOTE — Op Note (Signed)
[name], SAVANNAH NO.:  [id number]   MEDICAL RECORD NO.:  [id number]          PATIENT TYPE:  AMB   LOCATION:  [facility]                         FACILITY:  [facility]   PHYSICIAN:  Martina Sinner, MD DATE OF BIRTH:  Feb 26, 1920   DATE OF PROCEDURE:  05/28/2009  DATE OF DISCHARGE:                               OPERATIVE REPORT   PREOPERATIVE DIAGNOSIS:  Neurogenic bladder and refractory urge  incontinence.   POSTOPERATIVE DIAGNOSIS:  Neurogenic bladder and refractory urge  incontinence.   SURGERY:  Cystoscopy, injection of Botox.   Ms. Hou has had a stroke and in my opinion has a neurogenic bladder  with urge incontinence.  She was scheduled for Botox 150 units.   The patient was prepped and draped in the usual fashion.  Preoperative  antibiotics were given.  ACMI scope was utilized.  Bladder mucosa and  trigone were normal.  There was no stitch, foreign body or carcinoma.  With the bladder partially full I injected 150 units in 15 mL of normal  saline using my usual template at 5 and 7 o'clock and cephalad to the  interureteric ridge.  There was no bleeding.  Bladder was emptied.  The  patient was taken to the recovery room and will be followed as per  protocol.           ______________________________  Martina Sinner, MD  Electronically Signed     SAM/MEDQ  D:  05/28/2009  T:  05/28/2009  Job:  [id number]

## 2011-04-07 NOTE — Assessment & Plan Note (Signed)
[facility]                            CARDIOLOGY OFFICE NOTE   NAME:[name], [name]                      MRN:          [id number]  DATE:11/28/2007                            DOB:          1920/04/10    PRIMARY CARE PHYSICIAN:  None.   REASON FOR VISIT:  Evaluate patient with recent TIA.   HISTORY OF PRESENT ILLNESS:  The patient returns after hospitalization.  I saw her in October for evaluation of an abnormal EKG and then for  follow-up of hypertension.  She most recently came back to see Dr. Tenny Craw  in my absence.  She was having symptoms concerning for a TIA.  She was  sent for an MRI.  This done on December 17, demonstrated an acute  infarct in the right anterior MCA division suspicious for an embolic  source.  She was hospitalized and cared for on the teaching service.  She did have an echocardiogram which demonstrated an EF of 55-60% with  no wall motion abnormalities.  There was no source of embolism  identified.  She had carotid Dopplers demonstrating no stenosis on the  right or the left ICA.  The patient was managed medically and sent home.  She has been living by herself.  There has been a little left leg  weakness occasionally when she misses a step.  However, otherwise she  states she is fine.  Her daughter says she may have a little more  trouble remembering the date or time, but she has been able to do her  chores of daily living without any problems.  She has not been having  any visual, motor, disturbances.  She has had no problems with her  speech.  She is not noticing any palpations, had no presyncope or  syncope.  She has had no chest pain of shortness of breath.   She has been keeping a blood pressure diary and her blood pressures have  been trending down into the 130s to 120s systolic with reasonable  diastolics.   PAST MEDICAL HISTORY:  TIA with an abnormal MRI, hypertension, breast  cancer status post radiation and  lumpectomy of the left breast.   ALLERGIES:  No known drug allergies.   MEDICATIONS:  1. Bisoprolol HCT 5/6.25 mg daily.  2. Fosamax.  3. Ciprofloxacin.  4. Simvastatin 40 mg daily.  5. Aspirin 81 mg daily.   REVIEW OF SYSTEMS:  As stated in the HPI and otherwise negative for  other symptoms.   PHYSICAL EXAMINATION:  GENERAL:  The patient is in no distress.  VITAL SIGNS:  Blood pressure 117/56, heart rate 65 and regular, weight  105 pounds, body mass index 19.  NECK:  No jugular venous distention to 45 degrees.  Carotid upstroke  brisk and symmetric with no bruits, no thyromegaly.  LYMPHATICS:  No cervical, axillary, or inguinal adenopathy.  LUNGS:  Clear to auscultation bilaterally.  BACK:  No costovertebral angle tenderness.  CHEST:  Unremarkable.  HEART:  PMI not displaced or sustained.  S1 and S2 within normal limits.  No S3, no S4, no clicks, no rubs,  and no murmurs.  ABDOMEN:  Flat, positive bowel sounds.  Normal in frequency and pitch.  No bruits, no rebound, no guarding, no midline pulsatile mass, no  hepatomegaly and no splenomegaly.  SKIN:  No rashes and no nodules.  EXTREMITIES:  2+ pulses throughout.  No cyanosis, clubbing, or edema.  NEUROLOGY:  Oriented to person, place, and time.  Cranial nerves II-XII  grossly intact.  Motor grossly intact.   ASSESSMENT:  1. Transient ischemic attack.  The patient did have a transient      ischemic attack with an abnormal MRI.  I am going to get her a      neurology appointment to see if there is anything they would add to      her treatment or evaluation, though, she seems to be completely      recovered from this event.  2. Hypertension.  Blood pressure is well controlled on the current      regimen and she will continue on this.  3. Dyslipidemia.  She was started on Zocor in the hospital.  She did      have an LDL of 139.  This was reasonable.  We will check a lipid      profile in the weeks ahead.  4. Other.  The  patient requested a primary care doctor and one will be      arranged.   FOLLOWUP:  I will see her back in three months or sooner if she has any  problems.     Rollene Rotunda, MD, [facility]  Electronically Signed    JH/MedQ  DD: 11/28/2007  DT: 11/28/2007  Job #: [id number]

## 2011-04-09 ENCOUNTER — Encounter: Payer: Self-pay | Admitting: Internal Medicine

## 2011-04-09 ENCOUNTER — Ambulatory Visit (INDEPENDENT_AMBULATORY_CARE_PROVIDER_SITE_OTHER): Payer: Medicare Other | Admitting: Internal Medicine

## 2011-04-09 DIAGNOSIS — R4182 Altered mental status, unspecified: Secondary | ICD-10-CM

## 2011-04-09 DIAGNOSIS — I509 Heart failure, unspecified: Secondary | ICD-10-CM

## 2011-04-09 DIAGNOSIS — I5032 Chronic diastolic (congestive) heart failure: Secondary | ICD-10-CM

## 2011-04-09 DIAGNOSIS — R239 Unspecified skin changes: Secondary | ICD-10-CM

## 2011-04-09 DIAGNOSIS — R238 Other skin changes: Secondary | ICD-10-CM

## 2011-04-10 NOTE — Op Note (Signed)
NAMEDONICIA, DRUCK NO.:  [id number]   MEDICAL RECORD NO.:  [id number]          PATIENT TYPE:  AMB   LOCATION:  DSC                          FACILITY:  [facility]   PHYSICIAN:  Currie Paris, M.D.DATE OF BIRTH:  Apr 16, 1920   DATE OF PROCEDURE:  06/02/2006  DATE OF DISCHARGE:                                 OPERATIVE REPORT   PREOPERATIVE DIAGNOSIS:  Carcinoma left breast subareolar.   POSTOPERATIVE DIAGNOSIS:  Carcinoma left breast subareolar.   OPERATION:  Left partial mastectomy.   SURGEON:  Currie Paris, M.D.   ANESTHESIA:  MAC.   CLINICAL HISTORY:  [name] is an 77 year old lady who was recently found  have a small nodule subareolarly on the left.  It did not show up on  mammogram or ultrasound.  Excisional biopsy was performed and this appeared  to be an invasive ductal carcinoma with some lobular features.  We had a  positive margin.   After a discussion with the patient and presentation at the breast  conference, it was elected to proceed to a partial mastectomy to include the  nipple areolar excision.  It was felt that axillary sampling was not needed  in this patient as the oncologist felt like it would not change any of her  postoperative therapy.  This was discussed with the patient and her family  and she presents today for surgery.   DESCRIPTION OF PROCEDURE:  The patient was seen in the holding area and she  had no further questions.  We went over the issues again with her and her  daughter.  The left breast was marked by the patient and myself as the  operative side.   The patient was then taken to have room and after satisfactory IV sedation,  the left breast was prepped and draped.  The time-out occurred.   I outlined an elliptical incision to go around the nipple areolar complex.  I then made an incision and raised a superior flap going 1 cm or so above  the incision and then medially about 1 cm and then inferiorly and  then down  to the chest wall.  I then came around and took out the entire skin,  subcutaneous tissue, and breast tissue down to chest wall, full-thickness  excision of the central portion of the breast.  This all appeared to be  normal breast tissue at the margins.  This was sent for touch preps which  subsequently came back negative.   Once I had all was done and we had infiltrated more local to make sure we  had good anesthesia while we were working, I then closed the incision using  several layers with 3-0 Vicryl to reapproximate the central portion of the  breast and then 4-0 Monocryl subcuticular and Dermabond on the skin.   The patient tolerated the procedure well.  There no operative complications.  All counts were correct.      Currie Paris, M.D.  Electronically Signed     CJS/MEDQ  D:  06/02/2006  T:  06/02/2006  Job:  [id number]  cc:   Gabriel Earing, M.D.  Fax: [phone]   Randye Lobo, M.D.  Fax: [phone]

## 2011-04-10 NOTE — Op Note (Signed)
NAMEESTEFANA, [name] NO.:  [id number]   MEDICAL RECORD NO.:  [id number]          PATIENT TYPE:  AMB   LOCATION:  DSC                          FACILITY:  [facility]   PHYSICIAN:  Currie Paris, M.D.DATE OF BIRTH:  [birth year]   DATE OF PROCEDURE:  05/03/2006  DATE OF DISCHARGE:                                 OPERATIVE REPORT   OFFICE MEDICAL:  [facility] [phone]   PREOPERATIVE DIAGNOSIS:  Palpable left breast mass.   POSTOPERATIVE DIAGNOSIS:  Palpable left breast mass.   OPERATION:  Excision left breast mass.   SURGEON:  Currie Paris, M.D.   ANESTHESIA:  MAC.   CLINICAL HISTORY:  This is a 77 year old lady who has presented with a small  left breast mass which was subareolar.  It was not seen on either  mammography or ultrasound.  After discussion with the patient she elected to  proceed to an excisional biopsy.   DESCRIPTION OF PROCEDURE:  The patient was seen in the holding area and had  no further questions.  She marked and identified the left breast as the  operative side.   The patient taken to the operating room and was given IV sedation.  The mass  was marked.  The breast was prepped and draped.  The time-out occurred.   I infiltrated a combination 1% Xylocaine plain and 0.5% Marcaine with  epinephrine mixed equally used for local.  I made a short curvilinear  incision right at the areolar margin.  The mass was a fairly hard area of  fibrotic appearing breast tissue which was excised.  It was really attached  to the undersurface of the areola.   Bleeders coagulated.  When everything was dry the incisions closed with some  4-0 Monocryl subcuticular and Dermabond.   The patient tolerated procedure well.  There were no operative  complications.  All counts were correct.      Currie Paris, M.D.  Electronically Signed     CJS/MEDQ  D:  05/03/2006  T:  05/03/2006  Job:  [id number]   cc:   Randye Lobo, M.D.  Fax: [phone]   Gabriel Earing, M.D.  Fax: [phone]

## 2011-04-12 DIAGNOSIS — I429 Cardiomyopathy, unspecified: Secondary | ICD-10-CM | POA: Insufficient documentation

## 2011-04-12 NOTE — Progress Notes (Signed)
  Subjective:    Patient ID: Estanislado Pandy, female    DOB: 06/17/1920, 77 y.o.   MRN: [id number]  HPI Mrs. Bejarano is followed for dementia and CHF. She has been doing well. Her grand-daughter brings her in today due to skin change on the left heel: area of drying skin and fissure developing. There has been no bleeding or drainage. They have been applying moisturizing lotion and triple antibiotic cream. There is minimal pain or discomfort.   PMH, FamHx and SocHx reviewed for any changes and relevance.    Review of Systems 12 point review of systems is negative including respiratory and psych.     Objective:   Physical Exam WNWD elderly WW in no distress. Pul - normal respirations without increased WOB Cor- RRR, good peripheral pulses including DP Derm - left heel with erythema, minor scaling of epidermis without deep fissure.       Assessment & Plan:  1. CHF- well compensated.  Plan - continue present medication  2. Dementia - doing well on present medication. Family reports that there has been no progressive symptoms  3. Skin change - dryness and early fissure.  Plan - pressure reduction - heel pads           Soap and water wash followed by application of moisturizing lotion and a large bandaid to prevent abrasion.           For progression will refer to wound center.

## 2011-04-13 ENCOUNTER — Other Ambulatory Visit: Payer: Self-pay | Admitting: Internal Medicine

## 2011-04-15 ENCOUNTER — Ambulatory Visit (INDEPENDENT_AMBULATORY_CARE_PROVIDER_SITE_OTHER): Payer: Medicare Other | Admitting: *Deleted

## 2011-04-15 DIAGNOSIS — I4891 Unspecified atrial fibrillation: Secondary | ICD-10-CM

## 2011-04-15 DIAGNOSIS — I6350 Cerebral infarction due to unspecified occlusion or stenosis of unspecified cerebral artery: Secondary | ICD-10-CM

## 2011-04-15 DIAGNOSIS — Z7901 Long term (current) use of anticoagulants: Secondary | ICD-10-CM

## 2011-04-15 LAB — POCT INR: INR: 2.4

## 2011-04-20 ENCOUNTER — Other Ambulatory Visit: Payer: Self-pay | Admitting: Cardiology

## 2011-05-06 ENCOUNTER — Ambulatory Visit (INDEPENDENT_AMBULATORY_CARE_PROVIDER_SITE_OTHER): Payer: Medicare Other | Admitting: *Deleted

## 2011-05-06 DIAGNOSIS — Z7901 Long term (current) use of anticoagulants: Secondary | ICD-10-CM

## 2011-05-06 DIAGNOSIS — I4891 Unspecified atrial fibrillation: Secondary | ICD-10-CM

## 2011-05-06 DIAGNOSIS — I6350 Cerebral infarction due to unspecified occlusion or stenosis of unspecified cerebral artery: Secondary | ICD-10-CM

## 2011-05-06 LAB — POCT INR: INR: 1.8

## 2011-05-20 ENCOUNTER — Encounter: Payer: Medicare Other | Admitting: *Deleted

## 2011-05-20 ENCOUNTER — Ambulatory Visit (INDEPENDENT_AMBULATORY_CARE_PROVIDER_SITE_OTHER): Payer: Medicare Other | Admitting: *Deleted

## 2011-05-20 DIAGNOSIS — I6350 Cerebral infarction due to unspecified occlusion or stenosis of unspecified cerebral artery: Secondary | ICD-10-CM

## 2011-05-20 DIAGNOSIS — I4891 Unspecified atrial fibrillation: Secondary | ICD-10-CM

## 2011-05-20 DIAGNOSIS — Z7901 Long term (current) use of anticoagulants: Secondary | ICD-10-CM

## 2011-06-10 ENCOUNTER — Ambulatory Visit (INDEPENDENT_AMBULATORY_CARE_PROVIDER_SITE_OTHER): Payer: Medicare Other | Admitting: *Deleted

## 2011-06-10 DIAGNOSIS — I6350 Cerebral infarction due to unspecified occlusion or stenosis of unspecified cerebral artery: Secondary | ICD-10-CM

## 2011-06-10 DIAGNOSIS — Z7901 Long term (current) use of anticoagulants: Secondary | ICD-10-CM

## 2011-06-10 DIAGNOSIS — I4891 Unspecified atrial fibrillation: Secondary | ICD-10-CM

## 2011-07-08 ENCOUNTER — Ambulatory Visit (INDEPENDENT_AMBULATORY_CARE_PROVIDER_SITE_OTHER): Payer: Medicare Other | Admitting: *Deleted

## 2011-07-08 DIAGNOSIS — I6350 Cerebral infarction due to unspecified occlusion or stenosis of unspecified cerebral artery: Secondary | ICD-10-CM

## 2011-07-08 DIAGNOSIS — I4891 Unspecified atrial fibrillation: Secondary | ICD-10-CM

## 2011-07-08 DIAGNOSIS — Z7901 Long term (current) use of anticoagulants: Secondary | ICD-10-CM

## 2011-08-05 ENCOUNTER — Other Ambulatory Visit: Payer: Self-pay | Admitting: Obstetrics and Gynecology

## 2011-08-05 ENCOUNTER — Ambulatory Visit (INDEPENDENT_AMBULATORY_CARE_PROVIDER_SITE_OTHER): Payer: Medicare Other | Admitting: *Deleted

## 2011-08-05 DIAGNOSIS — I6350 Cerebral infarction due to unspecified occlusion or stenosis of unspecified cerebral artery: Secondary | ICD-10-CM

## 2011-08-05 DIAGNOSIS — Z9889 Other specified postprocedural states: Secondary | ICD-10-CM

## 2011-08-05 DIAGNOSIS — I4891 Unspecified atrial fibrillation: Secondary | ICD-10-CM

## 2011-08-05 DIAGNOSIS — Z7901 Long term (current) use of anticoagulants: Secondary | ICD-10-CM

## 2011-08-05 DIAGNOSIS — Z853 Personal history of malignant neoplasm of breast: Secondary | ICD-10-CM

## 2011-08-19 ENCOUNTER — Other Ambulatory Visit: Payer: Self-pay | Admitting: Internal Medicine

## 2011-08-24 ENCOUNTER — Ambulatory Visit
Admission: RE | Admit: 2011-08-24 | Discharge: 2011-08-24 | Disposition: A | Discharge status: Home / Self Care | Payer: Medicare Other | Source: Ambulatory Visit | Attending: Obstetrics and Gynecology | Admitting: Obstetrics and Gynecology

## 2011-08-24 DIAGNOSIS — Z853 Personal history of malignant neoplasm of breast: Secondary | ICD-10-CM

## 2011-08-24 DIAGNOSIS — Z9889 Other specified postprocedural states: Secondary | ICD-10-CM

## 2011-08-24 LAB — COMPREHENSIVE METABOLIC PANEL
Albumin: 3.4 — ABNORMAL LOW
Alkaline Phosphatase: 35 — ABNORMAL LOW
BUN: 25 — ABNORMAL HIGH
Calcium: 9.5
Glucose, Bld: 107 — ABNORMAL HIGH
Potassium: 3.8
Total Protein: 6.8

## 2011-08-24 LAB — CBC
HCT: 22.5 — ABNORMAL LOW
HCT: 28.9 — ABNORMAL LOW
HCT: 33.6 — ABNORMAL LOW
Hemoglobin: 7.6 — CL
MCHC: 33.9
Platelets: 157
Platelets: 206
RBC: 2.4 — ABNORMAL LOW
RBC: 3.63 — ABNORMAL LOW
RDW: 13.9
RDW: 14.2
WBC: 5.3
WBC: 7.4

## 2011-08-24 LAB — BASIC METABOLIC PANEL
BUN: 15
Calcium: 8.3 — ABNORMAL LOW
Chloride: 105
Creatinine, Ser: 0.83
GFR calc Af Amer: >60
GFR calc Af Amer: >60
GFR calc non Af Amer: >60
GFR calc non Af Amer: >60
Glucose, Bld: 108 — ABNORMAL HIGH
Potassium: 3.4 — ABNORMAL LOW
Potassium: 3.9
Sodium: 141

## 2011-08-24 LAB — ABO/RH: ABO/RH(D): O POS

## 2011-08-24 LAB — CROSSMATCH: Antibody Screen: NEGATIVE

## 2011-08-24 LAB — PROTIME-INR
INR: 0.9
Prothrombin Time: 12.6

## 2011-08-24 LAB — APTT: aPTT: 30

## 2011-08-28 LAB — URINE CULTURE
Colony Count: >=100000
Special Requests: POSITIVE

## 2011-08-28 LAB — CBC
HCT: 33 — ABNORMAL LOW
HCT: 33.2 — ABNORMAL LOW
MCHC: 33.7
MCV: 92.9
Platelets: 196
RBC: 3.55 — ABNORMAL LOW
RDW: 13.4
WBC: 5.4

## 2011-08-28 LAB — URINE MICROSCOPIC-ADD ON

## 2011-08-28 LAB — URINALYSIS, ROUTINE W REFLEX MICROSCOPIC
Glucose, UA: 100 — AB
Ketones, ur: NEGATIVE
pH: 8.5 — ABNORMAL HIGH

## 2011-08-28 LAB — BASIC METABOLIC PANEL
BUN: 27 — ABNORMAL HIGH
CO2: 29
Calcium: 8.4
Chloride: 103
Creatinine, Ser: 1.18
GFR calc Af Amer: 55 — ABNORMAL LOW
GFR calc non Af Amer: 44 — ABNORMAL LOW
Glucose, Bld: 107 — ABNORMAL HIGH
Potassium: 4.3

## 2011-08-28 LAB — LIPID PANEL
Cholesterol: 202 — ABNORMAL HIGH
HDL: 49
LDL Cholesterol: 139 — ABNORMAL HIGH
Total CHOL/HDL Ratio: 4.1
Triglycerides: 68

## 2011-08-31 ENCOUNTER — Encounter: Payer: Self-pay | Admitting: Cardiology

## 2011-08-31 ENCOUNTER — Ambulatory Visit (INDEPENDENT_AMBULATORY_CARE_PROVIDER_SITE_OTHER): Payer: Medicare Other | Admitting: Cardiology

## 2011-08-31 DIAGNOSIS — I509 Heart failure, unspecified: Secondary | ICD-10-CM

## 2011-08-31 DIAGNOSIS — I4891 Unspecified atrial fibrillation: Secondary | ICD-10-CM

## 2011-08-31 NOTE — Assessment & Plan Note (Signed)
She tolerates this rhythm.  No change in therapy is indicated.

## 2011-08-31 NOTE — Progress Notes (Signed)
HPI Since I last saw the patient she did have some heart failure.  I reviewed the most recent echo and her EF was 25% which is slightly lower than the previous echo.  She responded nicely to lasix 20 mg.  Her granddaughter takes good care of her.  The patient has no new dypsnea, palpitations or syncope.  She has some orthostasis.  She has trace edema in the left leg.  No Known Allergies  Current Outpatient Prescriptions  Medication Sig Dispense Refill  . acetaminophen (TYLENOL) 500 MG tablet Take 500 mg by mouth as needed.        . docusate sodium (COLACE) 100 MG capsule Take 100 mg by mouth as needed.        . furosemide (LASIX) 20 MG tablet Take 20 mg by mouth daily.        . Glucosamine-Chondroitin (GLUCOSAMINE CHONDROITIN COMPLX) 500-250 MG CAPS Take 1 capsule by mouth daily. liquid      . metoprolol tartrate (LOPRESSOR) 25 MG tablet Take by mouth 2 (two) times daily. 1/2 tablet two times a day      . Multiple Vitamins-Minerals (CENTRUM) tablet Take 1 tablet by mouth daily. liquid      . NAMENDA 10 MG tablet TAKE 1 TABLET BY MOUTH TWICE DAILY  60 tablet  3  . warfarin (COUMADIN) 5 MG tablet TAKE 1 TABLET DAILY OR AS DIRECTED  45 tablet  3    Past Medical History  Diagnosis Date  . History of breast cancer     Hx of left, s/p lumpectomy '08 also XRT  . Hyperlipidemia   . Hypertension   . Osteoporosis   . History of CVA (cerebrovascular accident) 2008  . Urinary incontinence     irritable bladder  . PVD (peripheral vascular disease)     most recent ABI 0.79 on the right and 0.53 on the  left  . Hip fracture 2009    left  . Atrial fibrillation     Past Surgical History  Procedure Date  . Tonsillectomy   . Breast lumpectomy   . Orif left hip 07/2008  . Cystoscopy     w/botox    ROS:  As stated in the HPI and negative for all other systems.  PHYSICAL EXAM BP 118/70  Pulse 76  Ht 4\' 10"  (1.473 m)  Wt 111 lb (50.349 kg)  BMI 23.20 kg/m2 GENERAL:  Well appearing,  frail HEENT:  Pupils equal round and reactive, fundi not visualized, oral mucosa unremarkable NECK:  No jugular venous distention, waveform within normal limits, carotid upstroke brisk and symmetric, no bruits, no thyromegaly LYMPHATICS:  No cervical, inguinal adenopathy LUNGS:  Clear to auscultation bilaterally BACK:  No CVA tenderness, lordosis CHEST:  Unremarkable HEART:  PMI not displaced or sustained,S1 and S2 within normal limits, no S3, no S4, no clicks, no rubs, no murmurs ABD:  Flat, positive bowel sounds normal in frequency in pitch, no bruits, no rebound, no guarding, no midline pulsatile mass, no hepatomegaly, no splenomegaly EXT:  2 plus pulses throughout, no edema, no cyanosis no clubbing, muscle wasting normal for age SKIN:  No rashes no nodules NEURO:  Cranial nerves II through XII grossly intact, motor grossly intact throughout PSYCH:  Cognitively intact, oriented to person place and time   EKG:  Atrial fibrillation, rate 76, inferolateral T-wave inversions, poor anterior R-wave progression  ASSESSMENT AND PLAN

## 2011-08-31 NOTE — Patient Instructions (Signed)
Continue current medications  Follow up in 1 year with Dr Hochrein.  You will receive a letter in the mail 2 months before you are due.  Please call us when you receive this letter to schedule your follow up appointment.  

## 2011-08-31 NOTE — Assessment & Plan Note (Signed)
The patient presents for followup of cardiomyopathy. At this time and I agree with minimal medications. She does well with diuretics alone and probably with orthostasis would not tolerate other medications. I discussed this at length with her granddaughter.

## 2011-09-01 ENCOUNTER — Ambulatory Visit (INDEPENDENT_AMBULATORY_CARE_PROVIDER_SITE_OTHER): Payer: Medicare Other | Admitting: *Deleted

## 2011-09-01 DIAGNOSIS — I6350 Cerebral infarction due to unspecified occlusion or stenosis of unspecified cerebral artery: Secondary | ICD-10-CM

## 2011-09-01 DIAGNOSIS — Z7901 Long term (current) use of anticoagulants: Secondary | ICD-10-CM

## 2011-09-01 DIAGNOSIS — I4891 Unspecified atrial fibrillation: Secondary | ICD-10-CM

## 2011-09-01 LAB — POCT INR: INR: 2.8

## 2011-09-03 LAB — POCT I-STAT CREATININE
Creatinine, Ser: 1.2
Operator id: 272551

## 2011-09-03 LAB — CBC
HCT: 35.2 — ABNORMAL LOW
MCV: 92.8
Platelets: 202
RDW: 12.8

## 2011-09-03 LAB — DIFFERENTIAL
Basophils Absolute: 0
Eosinophils Absolute: 0
Eosinophils Relative: 0

## 2011-09-03 LAB — I-STAT 8, (EC8 V) (CONVERTED LAB)
Acid-Base Excess: 3 — ABNORMAL HIGH
Bicarbonate: 30.1 — ABNORMAL HIGH
HCT: 38
Operator id: 272551
pCO2, Ven: 55.1 — ABNORMAL HIGH

## 2011-09-03 LAB — POCT CARDIAC MARKERS: Operator id: 272551

## 2011-09-29 ENCOUNTER — Ambulatory Visit (INDEPENDENT_AMBULATORY_CARE_PROVIDER_SITE_OTHER): Payer: Medicare Other | Admitting: *Deleted

## 2011-09-29 DIAGNOSIS — Z7901 Long term (current) use of anticoagulants: Secondary | ICD-10-CM

## 2011-09-29 DIAGNOSIS — I6350 Cerebral infarction due to unspecified occlusion or stenosis of unspecified cerebral artery: Secondary | ICD-10-CM

## 2011-09-29 DIAGNOSIS — I4891 Unspecified atrial fibrillation: Secondary | ICD-10-CM

## 2011-10-01 ENCOUNTER — Ambulatory Visit: Payer: Medicare Other | Admitting: Internal Medicine

## 2011-10-02 ENCOUNTER — Ambulatory Visit (INDEPENDENT_AMBULATORY_CARE_PROVIDER_SITE_OTHER): Payer: Medicare Other | Admitting: Internal Medicine

## 2011-10-02 VITALS — BP 100/64 | HR 91 | Temp 97.5°F | Wt 113.0 lb

## 2011-10-02 DIAGNOSIS — I6350 Cerebral infarction due to unspecified occlusion or stenosis of unspecified cerebral artery: Secondary | ICD-10-CM

## 2011-10-02 DIAGNOSIS — R0609 Other forms of dyspnea: Secondary | ICD-10-CM

## 2011-10-02 DIAGNOSIS — I509 Heart failure, unspecified: Secondary | ICD-10-CM

## 2011-10-02 DIAGNOSIS — I10 Essential (primary) hypertension: Secondary | ICD-10-CM

## 2011-10-02 DIAGNOSIS — R269 Unspecified abnormalities of gait and mobility: Secondary | ICD-10-CM

## 2011-10-02 DIAGNOSIS — I4891 Unspecified atrial fibrillation: Secondary | ICD-10-CM

## 2011-10-02 DIAGNOSIS — E785 Hyperlipidemia, unspecified: Secondary | ICD-10-CM

## 2011-10-02 DIAGNOSIS — R0989 Other specified symptoms and signs involving the circulatory and respiratory systems: Secondary | ICD-10-CM

## 2011-10-02 DIAGNOSIS — Z23 Encounter for immunization: Secondary | ICD-10-CM

## 2011-10-04 NOTE — Assessment & Plan Note (Signed)
No evidence of decompensation at todays visit. Question if diuretics need to be continued given her hypotension.  Plan- defer to cardiology

## 2011-10-04 NOTE — Progress Notes (Signed)
  Subjective:    Patient ID: [name], female    DOB: 1920-01-10, 77 y.o.   MRN: [id number]  HPI Mrs. Tamer presents for follow-up. She has been doing well with no acute medical events in the interval since her last visit. Her grand-daughter is with her today and confirms that she has been medically stable. She does report that there is progressive dementia. However, on good days Ms. Koeneman is able to go out. She is in a w/c in the office but is able to ambulate with a walker at home.   Past Medical History  Diagnosis Date  . History of breast cancer     Hx of left, s/p lumpectomy '08 also XRT  . Hyperlipidemia   . Hypertension   . Osteoporosis   . History of CVA (cerebrovascular accident) 2008  . Urinary incontinence     irritable bladder  . PVD (peripheral vascular disease)     most recent ABI 0.79 on the right and 0.53 on the  left  . Hip fracture 2009    left  . Atrial fibrillation    Past Surgical History  Procedure Date  . Tonsillectomy   . Breast lumpectomy   . Orif left hip 07/2008  . Cystoscopy     w/botox   No family history on file. History   Social History  . Marital Status: Widowed    Spouse Name: N/A    Number of Children: N/A  . Years of Education: N/A   Occupational History  . 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 Smoking status: Never Smoker   . Smokeless tobacco: Not on file  . Alcohol Use: No  . Drug Use: Not on file  . Sexually Active: Not on file   Other Topics Concern  . Not on file   Social History Narrative   Has supportive daughterLives with granddaughter - widowed 2007, after 40 years of marriage       Review of Systems  Constitutional: Negative for fever, chills, activity change and unexpected weight change.  HENT: Negative.   Eyes: Negative.   Respiratory: Negative for cough, choking, shortness of breath and wheezing.   Cardiovascular: Negative for chest pain.  Gastrointestinal: Negative.     Genitourinary: Positive for urgency and frequency.  Musculoskeletal: Negative.   Neurological: Positive for weakness. Negative for dizziness, speech difficulty and headaches.  Hematological: Negative.   Psychiatric/Behavioral: Positive for confusion. Negative for behavioral problems, dysphoric mood and agitation.       Objective:   Physical Exam Vitals - BP a little low Gen'l- frail, thin, small-framed woman in w/c who is in no distress Pulm- lungs are clear, respirations are normal. Cor - RRR Ext- w/o edema. Neuro - A&O to person, examiner, plan. CN II-XII normal.        Assessment & Plan:

## 2011-10-04 NOTE — Assessment & Plan Note (Signed)
She is stable. Her family are very careful with her so that her risk for fall is mitigated.

## 2011-10-04 NOTE — Assessment & Plan Note (Signed)
At age 77, almost 88, will not pursue treatment thus no testing indicated.

## 2011-10-04 NOTE — Assessment & Plan Note (Signed)
BP Readings from Last 3 Encounters:  10/02/11 100/64  08/31/11 118/70  04/09/11 108/72   Consistently running a little low. But, she tolerates this well. Meds for rate control will be continued. May be able to d/c diuretics.

## 2011-10-04 NOTE — Assessment & Plan Note (Signed)
Per Cardiology. She remains on warfarin. She is rate controlled at today's visit.

## 2011-10-20 ENCOUNTER — Other Ambulatory Visit: Payer: Self-pay | Admitting: Internal Medicine

## 2011-10-21 ENCOUNTER — Ambulatory Visit (INDEPENDENT_AMBULATORY_CARE_PROVIDER_SITE_OTHER): Payer: Medicare Other | Admitting: *Deleted

## 2011-10-21 DIAGNOSIS — I4891 Unspecified atrial fibrillation: Secondary | ICD-10-CM

## 2011-10-21 DIAGNOSIS — I6350 Cerebral infarction due to unspecified occlusion or stenosis of unspecified cerebral artery: Secondary | ICD-10-CM

## 2011-10-21 DIAGNOSIS — Z7901 Long term (current) use of anticoagulants: Secondary | ICD-10-CM

## 2011-10-21 LAB — POCT INR: INR: 2.6

## 2011-10-25 ENCOUNTER — Emergency Department (HOSPITAL_COMMUNITY): Payer: Medicare Other

## 2011-10-25 ENCOUNTER — Inpatient Hospital Stay (HOSPITAL_COMMUNITY)
Admission: EM | Admit: 2011-10-25 | Discharge: 2011-11-09 | DRG: 982 | Disposition: A | Discharge status: Home Health | Payer: Medicare Other | Attending: Internal Medicine | Admitting: Internal Medicine

## 2011-10-25 DIAGNOSIS — S32009A Unspecified fracture of unspecified lumbar vertebra, initial encounter for closed fracture: Secondary | ICD-10-CM | POA: Diagnosis present

## 2011-10-25 DIAGNOSIS — F0280 Dementia in other diseases classified elsewhere without behavioral disturbance: Secondary | ICD-10-CM | POA: Diagnosis present

## 2011-10-25 DIAGNOSIS — IMO0002 Reserved for concepts with insufficient information to code with codable children: Secondary | ICD-10-CM | POA: Diagnosis present

## 2011-10-25 DIAGNOSIS — Z7901 Long term (current) use of anticoagulants: Secondary | ICD-10-CM

## 2011-10-25 DIAGNOSIS — W06XXXA Fall from bed, initial encounter: Secondary | ICD-10-CM | POA: Diagnosis present

## 2011-10-25 DIAGNOSIS — R531 Weakness: Secondary | ICD-10-CM

## 2011-10-25 DIAGNOSIS — I4891 Unspecified atrial fibrillation: Secondary | ICD-10-CM | POA: Diagnosis present

## 2011-10-25 DIAGNOSIS — R269 Unspecified abnormalities of gait and mobility: Secondary | ICD-10-CM | POA: Diagnosis present

## 2011-10-25 DIAGNOSIS — S72009A Fracture of unspecified part of neck of unspecified femur, initial encounter for closed fracture: Secondary | ICD-10-CM | POA: Insufficient documentation

## 2011-10-25 DIAGNOSIS — N289 Disorder of kidney and ureter, unspecified: Secondary | ICD-10-CM | POA: Diagnosis present

## 2011-10-25 DIAGNOSIS — T68XXXA Hypothermia, initial encounter: Secondary | ICD-10-CM | POA: Diagnosis present

## 2011-10-25 DIAGNOSIS — Z853 Personal history of malignant neoplasm of breast: Secondary | ICD-10-CM

## 2011-10-25 DIAGNOSIS — R930 Abnormal findings on diagnostic imaging of skull and head, not elsewhere classified: Secondary | ICD-10-CM | POA: Diagnosis present

## 2011-10-25 DIAGNOSIS — F71 Moderate intellectual disabilities: Secondary | ICD-10-CM | POA: Diagnosis present

## 2011-10-25 DIAGNOSIS — N182 Chronic kidney disease, stage 2 (mild): Secondary | ICD-10-CM | POA: Diagnosis present

## 2011-10-25 DIAGNOSIS — E785 Hyperlipidemia, unspecified: Secondary | ICD-10-CM

## 2011-10-25 DIAGNOSIS — Z8673 Personal history of transient ischemic attack (TIA), and cerebral infarction without residual deficits: Secondary | ICD-10-CM

## 2011-10-25 DIAGNOSIS — E860 Dehydration: Secondary | ICD-10-CM

## 2011-10-25 DIAGNOSIS — Z66 Do not resuscitate: Secondary | ICD-10-CM | POA: Diagnosis present

## 2011-10-25 DIAGNOSIS — Y998 Other external cause status: Secondary | ICD-10-CM

## 2011-10-25 DIAGNOSIS — N179 Acute kidney failure, unspecified: Principal | ICD-10-CM | POA: Diagnosis present

## 2011-10-25 DIAGNOSIS — M810 Age-related osteoporosis without current pathological fracture: Secondary | ICD-10-CM | POA: Diagnosis present

## 2011-10-25 DIAGNOSIS — Z09 Encounter for follow-up examination after completed treatment for conditions other than malignant neoplasm: Secondary | ICD-10-CM

## 2011-10-25 DIAGNOSIS — R680 Hypothermia, not associated with low environmental temperature: Secondary | ICD-10-CM | POA: Diagnosis present

## 2011-10-25 DIAGNOSIS — F0390 Unspecified dementia without behavioral disturbance: Secondary | ICD-10-CM | POA: Diagnosis present

## 2011-10-25 HISTORY — DX: Peripheral vascular disease, unspecified: I73.9

## 2011-10-25 HISTORY — DX: Heart failure, unspecified: I50.9

## 2011-10-25 HISTORY — DX: Unspecified dementia, unspecified severity, without behavioral disturbance, psychotic disturbance, mood disturbance, and anxiety: F03.90

## 2011-10-25 HISTORY — DX: Cerebral infarction, unspecified: I63.9

## 2011-10-25 HISTORY — DX: Malignant neoplasm of unspecified site of unspecified female breast: C50.919

## 2011-10-25 LAB — PROTIME-INR
INR: 2.46 — ABNORMAL HIGH (ref 0.00–1.49)
Prothrombin Time: 27.1 seconds — ABNORMAL HIGH (ref 11.6–15.2)

## 2011-10-25 LAB — URINALYSIS, ROUTINE W REFLEX MICROSCOPIC
Glucose, UA: NEGATIVE mg/dL
Ketones, ur: NEGATIVE mg/dL
Leukocytes, UA: NEGATIVE
Protein, ur: NEGATIVE mg/dL

## 2011-10-25 LAB — URINE MICROSCOPIC-ADD ON

## 2011-10-25 LAB — POCT I-STAT TROPONIN I

## 2011-10-25 LAB — POCT I-STAT, CHEM 8
Glucose, Bld: 232 mg/dL — ABNORMAL HIGH (ref 70–99)
HCT: 42 % (ref 36.0–46.0)
Hemoglobin: 14.3 g/dL (ref 12.0–15.0)
Potassium: 4.4 mEq/L (ref 3.5–5.1)
Sodium: 140 mEq/L (ref 135–145)

## 2011-10-25 MED ORDER — WARFARIN SODIUM 2.5 MG PO TABS
2.5000 mg | ORAL_TABLET | ORAL | Status: DC
  Filled 2011-10-25: qty 1

## 2011-10-25 MED ORDER — DEXTROSE 5 % IV SOLN
1.0000 g | Freq: Every day | INTRAVENOUS | Status: DC
  Administered 2011-10-26 – 2011-10-31 (×7): 1 g via INTRAVENOUS
  Filled 2011-10-25 (×8): qty 10

## 2011-10-25 MED ORDER — MEMANTINE HCL 10 MG PO TABS
10.0000 mg | ORAL_TABLET | Freq: Two times a day (BID) | ORAL | Status: DC
  Administered 2011-10-26 – 2011-11-09 (×29): 10 mg via ORAL
  Filled 2011-10-25 (×32): qty 1

## 2011-10-25 MED ORDER — WARFARIN SODIUM 2 MG PO TABS: 2.0000 mg | ORAL_TABLET | ORAL | Status: DC

## 2011-10-25 MED ORDER — WARFARIN SODIUM 2.5 MG PO TABS: 2.5000 mg | ORAL_TABLET | Freq: Every day | ORAL | Status: DC

## 2011-10-25 MED ORDER — WARFARIN SODIUM 5 MG PO TABS: 5.0000 mg | ORAL_TABLET | ORAL | Status: DC

## 2011-10-25 MED ORDER — SODIUM CHLORIDE 0.9 % IV SOLN
INTRAVENOUS | Status: AC
  Administered 2011-10-25: 23:00:00 via INTRAVENOUS

## 2011-10-25 MED ORDER — SODIUM CHLORIDE 0.9 % IV BOLUS (SEPSIS)
500.0000 mL | Freq: Once | INTRAVENOUS | Status: AC
  Administered 2011-10-25: 500 mL via INTRAVENOUS

## 2011-10-25 NOTE — ED Notes (Signed)
See trauma note

## 2011-10-25 NOTE — ED Notes (Signed)
Per [facility], pt from home, pt slipped off of bed onto floor c/o R hip pain, upon ems arrival pt's sbp 80, 18g LAC placed, pt placed in trendelenburg and given NS 200 ml bolus pt's bp then 116/60, pt taken out of trendelenburg and bp decreased to 82/36, hr ST 122, no R side shortening or rotation noted upon arrival to ED, pt placed on monitor, will continue to monitor

## 2011-10-25 NOTE — ED Notes (Signed)
Patient transported to CT 

## 2011-10-25 NOTE — Progress Notes (Signed)
SPIRITUAL CARE NOTE: 77 yo pt came in as a Level I trauma. Chaplain responded to a Trauma Page. Escorted granddaughter to family room and assisted her with contacting others. Trauma I was downgraded to Level II. Nurse approval to allow family into room. As chaplain was escorting granddaughter to pt room, he received a CODE BLUE page. Nursing and admissions staff took granddaughter to pt room. Pt due for transfer to an ICU  Orion Modest Chaplain 10/25/11 2:42 PM

## 2011-10-25 NOTE — ED Notes (Signed)
Patient asleep.  Family at bedside.  IV infusing without problem.

## 2011-10-25 NOTE — Progress Notes (Deleted)
SPIRITUAL CARE NOTE: CODE Blue @ 1145. Chaplain responded and stood with son while medical staff attended the pt. Pt was stabilized. While waiting for result of medical interventions, pt's son voiced concern over the fact that pt had a full code, because he feared death, but he had unlimited Medical Power of Attorney, which means that family members will pressure him to change code status to DNR if pt remains on machines a long futile time. Before leaving chaplain prayed at son's request, for father's healing, and of discernment if prolonged machine support occurs.  Leonette Most Wisdom Seybold Chaplain 10/25/11  2:52 PM

## 2011-10-25 NOTE — ED Notes (Signed)
Stood pt up on side of bed, pt had to sit back down, unable to get a standing bp. Unable to walk pt.

## 2011-10-25 NOTE — ED Notes (Signed)
Attempt to call report to [phone]

## 2011-10-25 NOTE — H&P (Addendum)
Chief Complaint:  Found down  HPI: 77 yo female with MMP lives with her granddtr who is her POA/HCPOA who normally wakes her grandmother up around 8-9am and when she went to wake her up this am around 9am found her on the floor next to the bed lying on her side.  Pt has mild to mod dementia and at her baseline has to walk with walker and assistance.  There was no blood around the scene and pt was not complaining of any pain and had no idea how long she had been on the floor.  She was able to get up from the floor with assistance but then could not ambulate very far without getting weak again, the whole time denied any pain.  ems was called to assist family to get her to ED due to gait abnormality.  Family says she was in normal state of health over the last couple of days, no fever/ n/v/d/cough/swelling/eating and drinking normally.  No cp/sob/bleeding.  Review of Systems:  O/w neg  Past Medical History: Past Medical History  Diagnosis Date  . CHF (congestive heart failure)   . Hypertension   . Osteoporosis   . CVA (cerebral vascular accident) 2008  . Peripheral vascular disease   . Urinary incontinence   . Hyperlipidemia   . Fracture of hip   . Dementia   . Cancer of breast, female    Past Surgical History  Procedure Date  . Tonsillectomy   . Breast lumpectomy     left breast  . Orif left hip 2009  . Eye surgery     cataracts    Medications: Prior to Admission medications   Medication Sig Start Date End Date Taking? Authorizing Provider  Acetaminophen (TYLENOL PO) Take 1 tablet by mouth every 6 (six) hours as needed. For pain    Yes Historical Provider, MD  furosemide (LASIX) 20 MG tablet Take 20 mg by mouth daily.     Yes Historical Provider, MD  GLUCOSAMINE PO Take 30 mLs by mouth daily.     Yes Historical Provider, MD  memantine (NAMENDA) 10 MG tablet Take 10 mg by mouth 2 (two) times daily.     Yes Historical Provider, MD  metoprolol tartrate (LOPRESSOR) 25 MG tablet  Take 12.5 mg by mouth 2 (two) times daily.     Yes Historical Provider, MD  Multiple Vitamins-Minerals (MULTIVITAMIN) LIQD Take 5 mLs by mouth daily.     Yes Historical Provider, MD  warfarin (COUMADIN) 5 MG tablet Take 2.5-5 mg by mouth daily. Takes 0.5 tablet (2.5mg ) on Mondays, Wednesdays, and Fridays. Takes 1 tablet (5mg ) on remainder days.    Yes Historical Provider, MD    Allergies:  No Known Allergies  Social History:  reports that she has never smoked. She does not have any smokeless tobacco history on file. She reports that she does not drink alcohol or use illicit drugs.  Family History: History reviewed. No pertinent family history.  Physical Exam: Filed Vitals:   10/25/11 1547 10/25/11 1635 10/25/11 1825 10/25/11 1848  BP: 124/83 128/74 118/74 133/69  Pulse: 98  78 95  Temp:      TempSrc:      Resp: 18  16 18   Height:      Weight:      SpO2: 99% 99% 99% 100%   General appearance: alert, cooperative and no distress Head: Normocephalic, without obvious abnormality, atraumatic Eyes: negative Neck: no adenopathy, no carotid bruit, no JVD, supple, symmetrical, trachea midline  and thyroid not enlarged, symmetric, no tenderness/mass/nodules Resp: clear to auscultation bilaterally Cardio: irregularly irregular rhythm, S1, S2 normal and no S3 or S4 GI: soft, non-tender; bowel sounds normal; no masses,  no organomegaly Extremities: extremities normal, atraumatic, no cyanosis or edema Skin: Skin color, texture, turgor normal. No rashes or lesions Neurologic: Grossly normal MSC:  Pelvic stable with pressure/mobility, from of upper and lower ext at all joints without pain.  Cervical spine from without pain  Labs on Admission:   [facility] 10/25/11 1138  NA 140  K 4.4  CL 104  CO2 --  GLUCOSE 232*  BUN 28*  CREATININE 1.40*  CALCIUM --  MG --  PHOS --    Basename 10/25/11 1138  WBC --  NEUTROABS --  HGB 14.3  HCT 42.0  MCV --  PLT --    Radiological Exams on  Admission: Dg Chest 2 View  10/25/2011  *RADIOLOGY REPORT*  Clinical Data: Weakness.  Fell.  CHEST - 2 VIEW  Comparison: None  Findings: The heart is enlarged.  The mediastinal and hilar contours are within normal limits.  There is tortuosity and dense calcification of the thoracic aorta.  Left lower lobe process is likely a combination of effusion and atelectasis.  No edema or pneumothorax.  The bony thorax is grossly intact.  There are compression deformities in the thoracic spine of uncertain age.  IMPRESSION:  1.  Mild cardiac enlargement. 2.  Left effusion and left lower lobe atelectasis.  Original Report Authenticated By: P. Loralie Champagne, M.D.   Dg Hip Complete Right  10/25/2011  *RADIOLOGY REPORT*  Clinical Data: Fall, right hip pain.  RIGHT HIP - COMPLETE 2+ VIEW  Comparison: None  Findings: Diffuse osteopenia.  Hardware noted in the left proximal femur.  No acute bony abnormality.  No fracture, no acute fracture, subluxation or dislocation.  SI joints are symmetric and unremarkable.  IMPRESSION: Osteopenia.  No acute bony abnormality.  Original Report Authenticated By: Cyndie Chime, M.D.   Ct Head Wo Contrast  10/25/2011  *RADIOLOGY REPORT*  Clinical Data: Fall, headache.  CT HEAD WITHOUT CONTRAST  Technique:  Contiguous axial images were obtained from the base of the skull through the vertex without contrast.  Comparison: None  Findings: There is atrophy and chronic small vessel disease changes.  Old right frontal/insular cortex infarct.  No acute infarction or hemorrhage.  No hydrocephalus or mass lesion.  Extensive soft tissue gas is noted on the bone windows and within the soft tissues around the muscles of mastication bilaterally.  I see no obvious fracture in the visualized upper face or orbits.  No orbital air.  The paranasal sinuses are clear as are the mastoids.  IMPRESSION: Extensive soft tissue gas around the muscles of mastication in the upper face.  This tracks superiorly in the  temporal regions bilaterally.  Visualized paranasal sinuses are clear with no visible fracture.  Old right frontal infarct.  Chronic small vessel disease, atrophy.  Original Report Authenticated By: Cyndie Chime, M.D.    Assessment/Plan Present on Admission:  77 yo female found down and weak without any evidence of traumatic injury.  .Weakness generalized  Obtain PT eval.  Suppose to see outpt PT tom am.  Will do as inpt. .Acute renal insufficiency  Gentle ivf overnight hold lasix.  Do not have baseline Cr. .Abnormality of gait and mobility  PT .Chronic a-fib  Cont coumadin.  Place on tele.  Serial cardiac enzymes. .Hypothermia  UA and cxr pending to  r/o underlying infection.  No temp checked since arrival in ED will reck. .Abnormal CT scan of head  ENT has been consulted and will see in am.  Ct max region ordered to assess for fx. .Fall from chair or bed .Dementia  Ck total cpk, urine and cxr.  ivf overnight.  ent eval.  Repeat labs in am.  obs status.  DNR status confirmed with granddtr.  High glucose w/o mention of DM, monitor and ck hga1c.  Abagael Kramm A 10/25/2011, 8:24 PM  Start empiric rocephin coverage for urine.  Culture ordered.

## 2011-10-25 NOTE — ED Notes (Signed)
Assisted pt to standing position.  Pt unable to bear weight at present time.

## 2011-10-25 NOTE — ED Notes (Signed)
Family at bedside. 

## 2011-10-25 NOTE — ED Notes (Signed)
Family remains at bedside. Pt sleeping. Awakens easily.  BP 124/78, pulse 98, resp 16, sat 98 on room air.  Pt denies pain at present

## 2011-10-25 NOTE — ED Notes (Signed)
RN on 3700 is not able to take report at this time

## 2011-10-25 NOTE — ED Notes (Signed)
Vital signs stable. 

## 2011-10-25 NOTE — ED Notes (Signed)
Family at beside. Family given emotional support. 

## 2011-10-25 NOTE — ED Notes (Signed)
Patient denies pain and is resting comfortably.  

## 2011-10-25 NOTE — ED Notes (Signed)
Per [facility] ems pt fell out of bed landing on the floor c/o R hip pain, sbp 80, hr 122

## 2011-10-25 NOTE — ED Notes (Signed)
Per granddaughter pt was on the floor approx 0900 this am, pt reported to granddaughter she had a nightmare, bp low while on the floor 98/65, pt became weak, unable to respond, and "limp" upon standing to take the pt to bathroom. No bp taken with position change, granddaughter sts "she was able to take 5-6 steps before collapsing." Granddaughter denies any change in pt's routine or behavior over last few days, pt a little more sleepy yesterday than normal, denies any pain or discomfort prior to fall. Similar episodes of the same in the past w/UTI or respiratory infection.

## 2011-10-25 NOTE — ED Notes (Signed)
Patient sitting up talking with her granddaughter, patient c/o right hip pain.  MD aware and orders received.

## 2011-10-25 NOTE — ED Notes (Signed)
Patient transported to X-ray 

## 2011-10-25 NOTE — ED Provider Notes (Addendum)
History     CSN: [id number] Arrival date & time: 10/25/2011 11:21 AM   First MD Initiated Contact with Patient 10/25/11 1131      Chief Complaint  Patient presents with  . Trauma    (Consider location/radiation/quality/duration/timing/severity/associated sxs/prior treatment) HPI Per granddaughter pt was on the floor approx 0900 this am, pt reported to granddaughter she had a nightmare, bp low while on the floor 98/65, pt became weak, unable to respond, and "limp" upon standing to take the pt to bathroom. No bp taken with position change, granddaughter sts "she was able to take 5-6 steps before collapsing." Granddaughter denies any change in pt's routine or behavior over last few days, pt a little more sleepy yesterday than normal, denies any pain or discomfort prior to fall. Similar episodes of the same in the past w/UTI or respiratory infection.   Past Medical History  Diagnosis Date  . CHF (congestive heart failure)   . Hypertension   . Osteoporosis   . CVA (cerebral vascular accident) 2008  . Peripheral vascular disease   . Urinary incontinence   . Hyperlipidemia   . Fracture of hip   . Dementia   . Cancer of breast, female     Past Surgical History  Procedure Date  . Tonsillectomy   . Breast lumpectomy     left breast  . Orif left hip 2009  . Eye surgery     cataracts    History reviewed. No pertinent family history.  History  Substance Use Topics  . Smoking status: Never Smoker   . Smokeless tobacco: Not on file  . Alcohol Use: No    OB History    Grav Para Term Preterm Abortions TAB SAB Ect Mult Living                  Review of Systems  Unable to perform ROS: Other    Allergies  Review of patient's allergies indicates no known allergies.  Home Medications   Current Outpatient Rx  Name Route Sig Dispense Refill  . TYLENOL PO Oral Take 1 tablet by mouth every 6 (six) hours as needed. For pain     . FUROSEMIDE 20 MG PO TABS Oral Take 20 mg by  mouth daily.      Marland Kitchen GLUCOSAMINE PO Oral Take 30 mLs by mouth daily.      Marland Kitchen MEMANTINE HCL 10 MG PO TABS Oral Take 10 mg by mouth 2 (two) times daily.      Marland Kitchen METOPROLOL TARTRATE 25 MG PO TABS Oral Take 12.5 mg by mouth 2 (two) times daily.      . MULTIVITAMIN PO LIQD Oral Take 5 mLs by mouth daily.      . WARFARIN SODIUM 5 MG PO TABS Oral Take 2.5-5 mg by mouth daily. Takes 0.5 tablet (2.5mg ) on Mondays, Wednesdays, and Fridays. Takes 1 tablet (5mg ) on remainder days.       BP 96/59  Pulse 95  Temp(Src) 95.8 F (35.4 C) (Rectal)  Resp 25  Ht 5' (1.524 m)  Wt 116 lb (52.617 kg)  BMI 22.65 kg/m2  SpO2 98%  Physical Exam  Nursing note reviewed. Constitutional: She appears well-developed and well-nourished. No distress.  HENT:  Head: Normocephalic and atraumatic.  Eyes: Conjunctivae and EOM are normal. Pupils are equal, round, and reactive to light.  Neck: Normal range of motion. No spinous process tenderness and no muscular tenderness present.  Cardiovascular: Normal rate and intact distal pulses.  An irregular rhythm  present.         Date: 10/25/2011  Rate: 100  Rhythm: atrial fibrillation  QRS Axis: normal  Intervals: normal  ST/T Wave abnormalities: nonspecific T wave changes  Conduction Disutrbances:none:   Old EKG Reviewed: changes noted (afib is old )     Pulmonary/Chest: No respiratory distress.  Abdominal: Soft. Normal appearance and bowel sounds are normal. She exhibits no distension. There is no tenderness. There is no rebound.  Musculoskeletal: Normal range of motion.       Right hip: She exhibits tenderness. She exhibits no crepitus and no deformity.       Left hip: She exhibits no tenderness and no bony tenderness.  Neurological: She is alert. No cranial nerve deficit.  Skin: Skin is warm and dry. No rash noted.  Psychiatric: She has a normal mood and affect. Her behavior is normal.    ED Course  Procedures (including critical care time)  Plan at the present  time is to hydrate the patient with some fluids and then will ambulate.  Granddaughter is with the patient if she is able to ambulate okay and granddaughters comfortable she can go home.  Labs Reviewed  PROTIME-INR - Abnormal; Notable for the following:    Prothrombin Time 27.1 (*)    INR 2.46 (*)    All other components within normal limits  POCT I-STAT, CHEM 8 - Abnormal; Notable for the following:    BUN 28 (*)    Creatinine, Ser 1.40 (*)    Glucose, Bld 232 (*)    Calcium, Ion 1.04 (*)    All other components within normal limits  POCT I-STAT TROPONIN I  I-STAT, CHEM 8  I-STAT TROPONIN I   Dg Hip Complete Right  10/25/2011  *RADIOLOGY REPORT*  Clinical Data: Fall, right hip pain.  RIGHT HIP - COMPLETE 2+ VIEW  Comparison: None  Findings: Diffuse osteopenia.  Hardware noted in the left proximal femur.  No acute bony abnormality.  No fracture, no acute fracture, subluxation or dislocation.  SI joints are symmetric and unremarkable.  IMPRESSION: Osteopenia.  No acute bony abnormality.  Original Report Authenticated By: Cyndie Chime, M.D.   Ct Head Wo Contrast  10/25/2011  *RADIOLOGY REPORT*  Clinical Data: Fall, headache.  CT HEAD WITHOUT CONTRAST  Technique:  Contiguous axial images were obtained from the base of the skull through the vertex without contrast.  Comparison: None  Findings: There is atrophy and chronic small vessel disease changes.  Old right frontal/insular cortex infarct.  No acute infarction or hemorrhage.  No hydrocephalus or mass lesion.  Extensive soft tissue gas is noted on the bone windows and within the soft tissues around the muscles of mastication bilaterally.  I see no obvious fracture in the visualized upper face or orbits.  No orbital air.  The paranasal sinuses are clear as are the mastoids.  IMPRESSION: Extensive soft tissue gas around the muscles of mastication in the upper face.  This tracks superiorly in the temporal regions bilaterally.  Visualized paranasal  sinuses are clear with no visible fracture.  Old right frontal infarct.  Chronic small vessel disease, atrophy.  Original Report Authenticated By: Cyndie Chime, M.D.     No diagnosis found.    MDM          Nelia Shi, MD 10/25/11 1902  Nelia Shi, MD 10/25/11 [phone]

## 2011-10-25 NOTE — ED Notes (Signed)
Attempt to call report to [phone] again, may bring pt up and give bedside report

## 2011-10-26 ENCOUNTER — Other Ambulatory Visit: Payer: Self-pay | Admitting: *Deleted

## 2011-10-26 ENCOUNTER — Other Ambulatory Visit: Payer: Self-pay

## 2011-10-26 DIAGNOSIS — I4891 Unspecified atrial fibrillation: Secondary | ICD-10-CM

## 2011-10-26 DIAGNOSIS — N390 Urinary tract infection, site not specified: Secondary | ICD-10-CM

## 2011-10-26 DIAGNOSIS — N182 Chronic kidney disease, stage 2 (mild): Secondary | ICD-10-CM

## 2011-10-26 DIAGNOSIS — R680 Hypothermia, not associated with low environmental temperature: Secondary | ICD-10-CM

## 2011-10-26 LAB — HEMOGLOBIN A1C: Mean Plasma Glucose: 126 mg/dL — ABNORMAL HIGH (ref <117)

## 2011-10-26 LAB — DIFFERENTIAL
Basophils Absolute: 0 10*3/uL (ref 0.0–0.1)
Basophils Relative: 0 % (ref 0–1)
Eosinophils Absolute: 0 10*3/uL (ref 0.0–0.7)
Lymphs Abs: 1 10*3/uL (ref 0.7–4.0)
Neutrophils Relative %: 83 % — ABNORMAL HIGH (ref 43–77)

## 2011-10-26 LAB — CBC
MCH: 31.4 pg (ref 26.0–34.0)
MCHC: 33.8 g/dL (ref 30.0–36.0)
Platelets: 128 10*3/uL — ABNORMAL LOW (ref 150–400)
RBC: 3.85 MIL/uL — ABNORMAL LOW (ref 3.87–5.11)
RDW: 14 % (ref 11.5–15.5)

## 2011-10-26 LAB — BASIC METABOLIC PANEL
CO2: 26 mEq/L (ref 19–32)
Calcium: 8.4 mg/dL (ref 8.4–10.5)
Creatinine, Ser: 1.13 mg/dL — ABNORMAL HIGH (ref 0.50–1.10)

## 2011-10-26 LAB — MAGNESIUM: Magnesium: 2.1 mg/dL (ref 1.5–2.5)

## 2011-10-26 LAB — CARDIAC PANEL(CRET KIN+CKTOT+MB+TROPI)
CK, MB: 22.1 ng/mL (ref 0.3–4.0)
CK, MB: 13.2 ng/mL (ref 0.3–4.0)
Relative Index: 1 (ref 0.0–2.5)
Total CK: 1462 U/L — ABNORMAL HIGH (ref 7–177)
Troponin I: <0.3 ng/mL (ref <0.30)
Troponin I: <0.3 ng/mL (ref <0.30)

## 2011-10-26 LAB — PROTIME-INR: Prothrombin Time: 33.9 seconds — ABNORMAL HIGH (ref 11.6–15.2)

## 2011-10-26 LAB — HEPATIC FUNCTION PANEL
AST: 86 U/L — ABNORMAL HIGH (ref 0–37)
Bilirubin, Direct: 0.1 mg/dL (ref 0.0–0.3)
Indirect Bilirubin: 0.6 mg/dL (ref 0.3–0.9)
Total Bilirubin: 0.7 mg/dL (ref 0.3–1.2)

## 2011-10-26 MED ORDER — METOPROLOL TARTRATE 1 MG/ML IV SOLN
2.5000 mg | Freq: Four times a day (QID) | INTRAVENOUS | Status: DC | PRN
  Administered 2011-10-26: 2.5 mg via INTRAVENOUS
  Filled 2011-10-26: qty 5

## 2011-10-26 MED ORDER — FUROSEMIDE 20 MG PO TABS
20.0000 mg | ORAL_TABLET | Freq: Every day | ORAL | Status: DC
  Administered 2011-10-26 – 2011-11-09 (×15): 20 mg via ORAL
  Filled 2011-10-26 (×15): qty 1

## 2011-10-26 MED ORDER — WARFARIN SODIUM 5 MG PO TABS: 5.0000 mg | ORAL_TABLET | ORAL | Status: DC

## 2011-10-26 MED ORDER — DEXTROSE 5 % IV SOLN: 1.0000 g | INTRAVENOUS | Status: DC

## 2011-10-26 MED ORDER — METOPROLOL TARTRATE 12.5 MG HALF TABLET
12.5000 mg | ORAL_TABLET | Freq: Two times a day (BID) | ORAL | Status: DC
  Administered 2011-10-26 – 2011-11-02 (×14): 12.5 mg via ORAL
  Filled 2011-10-26 (×16): qty 1

## 2011-10-26 NOTE — Progress Notes (Signed)
ANTICOAGULATION CONSULT NOTE - Initial Consult  Pharmacy Consult for Coumadin Indication: atrial fibrillation (with hx CVA)  No Known Allergies  Patient Measurements: Height: 5' (152.4 cm) Weight: 117 lb 8.1 oz (53.3 kg) IBW/kg (Calculated) : 45.5   Vital Signs: Temp: 98.2 F (36.8 C) (12/02 2140) Temp src: Rectal (12/02 2117) BP: 127/83 mmHg (12/02 2140) Pulse Rate: 101  (12/02 2140)  Labs:  Basename 10/25/11 1138 10/25/11 1128  HGB 14.3 --  HCT 42.0 --  PLT -- --  APTT -- --  LABPROT -- 27.1*  INR -- 2.46*  HEPARINUNFRC -- --  CREATININE 1.40* --  CKTOTAL -- --  CKMB -- --  TROPONINI -- --   Estimated Creatinine Clearance: 19.6 ml/min (by C-G formula based on Cr of 1.4).  Medical History: Past Medical History  Diagnosis Date  . CHF (congestive heart failure)   . Hypertension   . Osteoporosis   . CVA (cerebral vascular accident) 2008  . Peripheral vascular disease   . Urinary incontinence   . Hyperlipidemia   . Fracture of hip   . Dementia   . Cancer of breast, female     Medications:  Prescriptions prior to admission  Medication Sig Dispense Refill  . Acetaminophen (TYLENOL PO) Take 1 tablet by mouth every 6 (six) hours as needed. For pain       . furosemide (LASIX) 20 MG tablet Take 20 mg by mouth daily.        Marland Kitchen GLUCOSAMINE PO Take 30 mLs by mouth daily.        . memantine (NAMENDA) 10 MG tablet Take 10 mg by mouth 2 (two) times daily.        . metoprolol tartrate (LOPRESSOR) 25 MG tablet Take 12.5 mg by mouth 2 (two) times daily.        . Multiple Vitamins-Minerals (MULTIVITAMIN) LIQD Take 5 mLs by mouth daily.        Marland Kitchen warfarin (COUMADIN) 5 MG tablet Take 2.5-5 mg by mouth daily. Takes 0.5 tablet (2.5mg ) on Mondays, Wednesdays, and Fridays. Takes 1 tablet (5mg ) on remainder days.         Goal of Therapy:  INR 2-3  Assessment: 77 yo F found down by family, unknown how long pt was down, no injuries reported.  Brought to ER to evaluate  gait/weakness.  Asked to continue Coumadin management for AFib.  INR therapeutic.  Plan:  Will continue home Coumadin regimen of 2.5mg  on MWF and 5mg  other days.  Continue daily INR for now.  [employer], Pharm.D., BCPS Clinical Pharmacist Pager [phone]  10/26/2011,12:03 AM

## 2011-10-26 NOTE — Progress Notes (Signed)
Utilization review complete 

## 2011-10-26 NOTE — Progress Notes (Signed)
ANTICOAGULATION CONSULT NOTE - Follow Up Consult  Pharmacy Consult for Coumadin Indication: afib/h/o CVA  No Known Allergies  Patient Measurements: Height: 5' (152.4 cm) Weight: 119 lb 11.4 oz (54.3 kg) IBW/kg (Calculated) : 45.5    Vital Signs: Temp: 98.4 F (36.9 C) (12/03 1000) Temp src: Oral (12/03 1000) BP: 107/73 mmHg (12/03 1000) Pulse Rate: 102  (12/03 1000)  Labs:  Basename 10/26/11 0840 10/26/11 0645 10/26/11 0039 10/26/11 10/25/11 1138 10/25/11 1128  HGB -- -- 12.1 -- 14.3 --  HCT -- -- 35.8* -- 42.0 --  PLT -- -- 128* -- -- --  APTT -- -- -- -- -- --  LABPROT -- 33.9* -- -- -- 27.1*  INR -- 3.28* -- -- -- 2.46*  HEPARINUNFRC -- -- -- -- -- --  CREATININE -- 1.13* -- -- 1.40* --  CKTOTAL PENDING -- -- 2819* -- --  CKMB 22.1* -- -- 35.7* -- --  TROPONINI <0.30 -- -- <0.30 -- --   Estimated Creatinine Clearance: 24.2 ml/min (by C-G formula based on Cr of 1.13).   Medications:  Prescriptions prior to admission  Medication Sig Dispense Refill  . Acetaminophen (TYLENOL PO) Take 1 tablet by mouth every 6 (six) hours as needed. For pain       . furosemide (LASIX) 20 MG tablet Take 20 mg by mouth daily.        Marland Kitchen GLUCOSAMINE PO Take 30 mLs by mouth daily.        . memantine (NAMENDA) 10 MG tablet Take 10 mg by mouth 2 (two) times daily.        . metoprolol tartrate (LOPRESSOR) 25 MG tablet Take 12.5 mg by mouth 2 (two) times daily.        . Multiple Vitamins-Minerals (MULTIVITAMIN) LIQD Take 5 mLs by mouth daily.        Marland Kitchen warfarin (COUMADIN) 5 MG tablet Take 2.5-5 mg by mouth daily. Takes 0.5 tablet (2.5mg ) on Mondays, Wednesdays, and Fridays. Takes 1 tablet (5mg ) on remainder days.        Assessment: INR up to 3.28 this a.m. No bleeding noted.   Goal of Therapy:  INR 2-3   Plan:  1. Will d/c scheduled coumadin dose and hold coumadin for today 2. F/u daily INR  Cleon Signorelli, Hilario Quarry 10/26/2011,10:28 AM

## 2011-10-26 NOTE — Progress Notes (Signed)
Physical Therapy Evaluation Patient Details Name: [name] MRN: [id number] DOB: [birth year] Today's Date: 10/26/2011  Problem List:  Patient Active Problem List  Diagnoses  . Osteoporosis  . CVA (cerebral vascular accident)  . Peripheral vascular disease  . Urinary incontinence  . Hyperlipidemia  . Fracture of hip  . Dementia  . Weakness generalized  . Acute renal insufficiency  . Abnormality of gait and mobility  . Chronic a-fib  . Anticoagulant long-term use  . Hypothermia  . Abnormal CT scan of head  . Fall from chair or bed    Past Medical History:  Past Medical History  Diagnosis Date  . CHF (congestive heart failure)   . Hypertension   . Osteoporosis   . CVA (cerebral vascular accident) 2008  . Peripheral vascular disease   . Urinary incontinence   . Hyperlipidemia   . Fracture of hip   . Dementia   . Cancer of breast, female    Past Surgical History:  Past Surgical History  Procedure Date  . Tonsillectomy   . Breast lumpectomy     left breast  . Orif left hip 2009  . Eye surgery     cataracts    PT Assessment/Plan/Recommendation PT Assessment Clinical Impression Statement: Pt presents with dependencies in mobility and general strength. Pt demonstrated a flat affect and minimal participation during assessment. Pt's grand-daughter reports she can only take her back home if the pt is able to maintain standing to transfer. Pt would benefit from skilled PT to decrease dependence on caregiver and possibly return home with HHPT vs. SNF.  PT Recommendation/Assessment: Patient will need skilled PT in the acute care venue PT Problem List: Decreased strength;Decreased activity tolerance;Decreased balance;Decreased mobility;Decreased safety awareness PT Therapy Diagnosis : Difficulty walking;Generalized weakness PT Plan PT Frequency: Min 3X/week PT Treatment/Interventions: DME instruction;Gait training;Therapeutic activities;Therapeutic exercise;Balance  training;Patient/family education PT Recommendation Recommendations for Other Services: OT consult Follow Up Recommendations: Home health PT;Skilled nursing facility (depends on progress) Equipment Recommended: None recommended by PT PT Goals  Acute Rehab PT Goals PT Goal Formulation: With patient/family Time For Goal Achievement: 2 weeks Pt will go Supine/Side to Sit: with min assist PT Goal: Supine/Side to Sit - Progress: Progressing toward goal Pt will go Sit to Stand: with min assist PT Goal: Sit to Stand - Progress: Progressing toward goal Pt will go Stand to Sit: with min assist PT Goal: Stand to Sit - Progress: Progressing toward goal Pt will Transfer Bed to Chair/Chair to Bed: with min assist PT Transfer Goal: Bed to Chair/Chair to Bed - Progress: Progressing toward goal Pt will Ambulate: 16 - 50 feet;with min assist;with least restrictive assistive device Pt will Perform Home Exercise Program: with supervision, verbal cues required/provided PT Goal: Perform Home Exercise Program - Progress: Progressing toward goal  PT Evaluation Precautions/Restrictions    Prior Functioning  Home Living Lives With: Other (Comment) (grand-daughter) Receives Help From: Family Type of Home: House Home Layout: One level Home Access: Stairs to enter [employer] of Steps: 1 Home Adaptive Equipment: Walker - rolling;Wheelchair - manual;Bedside commode/3-in-1 Prior Function Level of Independence: Needs assistance with gait;Needs assistance with tranfers Driving: No Vocation: Retired Producer, television/film/video: Awake/alert Overall Cognitive Status: History of cognitive impairments History of Cognitive Impairment: Appears at baseline functioning Orientation Level: Oriented X4 Cognition - Other Comments: flat affect Sensation/Coordination Sensation Light Touch: Appears Intact Extremity Assessment RLE Assessment RLE Assessment: Exceptions to [facility] RLE PROM  (degrees) Overall PROM Right Lower Extremity: Within functional limits for tasks  assessed RLE Strength RLE Overall Strength: Deficits RLE Overall Strength Comments: 3-/5 with minimal effort LLE Assessment LLE Assessment: Exceptions to WFL LLE PROM (degrees) Overall PROM Left Lower Extremity: Within functional limits for tasks assessed LLE Strength LLE Overall Strength: Deficits LLE Overall Strength Comments: 2/5 with minimal effort and demo cues to follow testing commands Mobility (including Balance) Bed Mobility Bed Mobility: Yes Supine to Sit: 2: Max assist;HOB elevated (Comment degrees) (HOB 40) Supine to Sit Details (indicate cue type and reason): poor initiation, max cues for technique Sitting - Scoot to Edge of Bed: 2: Max assist Sitting - Scoot to [employer] of Bed Details (indicate cue type and reason): poor initiation Transfers Transfers: Yes Sit to Stand: 2: Max assist;From bed;Other (comment) (attempted with RW) Sit to Stand Details (indicate cue type and reason): Mod assist to stand initially then max assist to maintain standing due to left leg giving out. Tried a stand pivot to chair with therapist in front due to LE weakness. Stand Pivot Transfers: 3: Mod assist Stand Pivot Transfer Details (indicate cue type and reason): stand pivot with therapist in front. Pt able to take steps to pivot to chair. Max cues for technique and tactile cues to initiate activity. Ambulation/Gait Ambulation/Gait: No (unable due to LE weakness and general fatigue)  Posture/Postural Control Posture/Postural Control: Postural limitations Postural Limitations: thoracic kyphosis Balance Balance Assessed: Yes Static Sitting Balance Static Sitting - Balance Support: Bilateral upper extremity supported;Feet supported Static Sitting - Level of Assistance: 5: Stand by assistance Static Sitting - Comment/# of Minutes: 8 minutes sitting EOB, pt c/o dizziness BP 125/76, flat affect Static Standing  Balance Static Standing - Balance Support: Bilateral upper extremity supported Static Standing - Level of Assistance: 2: Max assist Static Standing - Comment/# of Minutes: 1 minute, unable to maintain full upright and left LE buckling Exercise  General Exercises - Lower Extremity Ankle Circles/Pumps: AROM;Strengthening;Both;10 reps;Seated Heel Slides: AROM;Strengthening;Both;5 reps;Seated End of Session PT - End of Session Equipment Utilized During Treatment: Gait belt Activity Tolerance: Patient limited by fatigue Patient left: in chair;with call bell in reach;with family/visitor present Nurse Communication: Mobility status for transfers General Behavior During Session: Flat affect Cognition: Impaired, at baseline  Greggory Stallion 10/26/2011, 10:32 AM

## 2011-10-26 NOTE — Progress Notes (Signed)
Subjective: Patient found down at home the AM 12/2. Brought to ED for evaluation: acute on chronic CKD II, a fib with RVR, increased confusion, CT with air in the soft tissues of mastication. She is weaker than her baseline - difficulty standing w/o assist.  She remains weak. She is able to relate what happened. She voices no particular c/o.  Objective: Lab: AM labs: K 4.2, Cr. 1.13 - lose to baseline, INR 3.28, CE neg x 3, WBC 13.3    Imaging:CT head - no fracture, acute stroke, air in soft tissue   Physical Exam: Vitals - afebrile, 99/56 117 22 Gen'l- very elderly white woman who is weak but in no acute distress HEENT- no palpable crepitus at the parotid,cheek area, no tenderness. C&S clear. PERRLA Chest- no increased work of breathing, no wheezing,  Cor - IRIR slightly elevated rate. Abd- soft, BS + Neuro- at her baseline but weak.     Assessment/Plan: 1. ID - patient with leukocytosis and initial fever. + U/A suggestive of UTI. POssible cause of weakness. Day # 2 Rocephin.  2. Card - chronic a.fib with RVR. Has been relatively rate controlled on lopressor 12.5 mg bid. Plan - will resume bid lopressor. If rate not controlled will change to Diltiazem.  3. Abnl CT - gas in soft tissue but a negative physical exam. No evidence of cellulitis or facial injury.  4. CKD II - her Creatinine is coming down with hydration.  5. H/o CHF - diastolic. She is getting fluids. Plan - resume home dose of furosemide 20mg  daily  6. Profound weakness - this has been a progressive problem at home now with exacerbation: infection +/ A fib w/ RVR Plan - continued PT            May need SNF  7. Code sattus - DNR     [name] 10/26/2011, 7:17 PM

## 2011-10-26 NOTE — Progress Notes (Signed)
Benedetto Coons, NP paged with critical results CK-MB 35.7 and total CK 2819.

## 2011-10-27 ENCOUNTER — Observation Stay (HOSPITAL_COMMUNITY): Payer: Medicare Other

## 2011-10-27 ENCOUNTER — Inpatient Hospital Stay (HOSPITAL_COMMUNITY): Payer: Medicare Other

## 2011-10-27 LAB — GLUCOSE, CAPILLARY
Glucose-Capillary: 113 mg/dL — ABNORMAL HIGH (ref 70–99)
Glucose-Capillary: 120 mg/dL — ABNORMAL HIGH (ref 70–99)

## 2011-10-27 LAB — URINE CULTURE: Culture: NO GROWTH

## 2011-10-27 MED ORDER — WARFARIN SODIUM 5 MG PO TABS
5.0000 mg | ORAL_TABLET | Freq: Once | ORAL | Status: AC
  Administered 2011-10-27: 5 mg via ORAL
  Filled 2011-10-27: qty 1

## 2011-10-27 NOTE — Progress Notes (Signed)
Subjective: Awake, alert but slow to respond Her caretaker is with her this AM and has done in bed exercise. Patient c/o back pain in the low back. Per her caregiver as of last Tuesday she was walking w/o support.  Objective: Lab: no new lab  Imaging: no new imaging  Tele: reviewed with clerk: A. Fib but rate controlled   Physical Exam: elderly white woman Chest - bibasilar rales, no wheezing or rhonchi Cor- IRIR III/VI systolic mm RSB Abd- BS+, increased muscle tone Neuro - dementia, psychomotor retardation.      Assessment/Plan: 1. ID - Day #3 rocephin. NO fever. Plan- AM CBCD 2.Card- rate better controlled on bid beta-blocker Plan- continue present dosing 4. CKD II - creatinine was close to baseline. Plan-Bmet in AM 5. H/o CHF - feint rales Plan- will change IVF to [facility] 6. Profound weakness and back pain. Plan - L-S spine films r/o compression fracture.   Casimiro Needle Wayman Hoard 10/27/2011, 7:27 AM

## 2011-10-27 NOTE — Progress Notes (Signed)
ANTICOAGULATION CONSULT NOTE - Follow Up Consult  Pharmacy Consult for Coumadin Indication: afib/h/o CVA  No Known Allergies  Patient Measurements: Height: 5' (152.4 cm) Weight: 119 lb 14.9 oz (54.4 kg) IBW/kg (Calculated) : 45.5    Vital Signs: Temp: 98.5 F (36.9 C) (12/04 0700) BP: 137/86 mmHg (12/04 0700) Pulse Rate: 69  (12/04 0700)  Labs:  Basename 10/27/11 0646 10/26/11 1604 10/26/11 0840 10/26/11 0645 10/26/11 0039 10/26/11 10/25/11 1138 10/25/11 1128  HGB -- -- -- -- 12.1 -- 14.3 --  HCT -- -- -- -- 35.8* -- 42.0 --  PLT -- -- -- -- 128* -- -- --  APTT -- -- -- -- -- -- -- --  LABPROT 26.9* -- -- 33.9* -- -- -- 27.1*  INR 2.44* -- -- 3.28* -- -- -- 2.46*  HEPARINUNFRC -- -- -- -- -- -- -- --  CREATININE -- -- -- 1.13* -- -- 1.40* --  CKTOTAL -- 1462* 2184* -- -- 2819* -- --  CKMB -- 13.2* 22.1* -- -- 35.7* -- --  TROPONINI -- <0.30 <0.30 -- -- <0.30 -- --   Estimated Creatinine Clearance: 24.2 ml/min (by C-G formula based on Cr of 1.13).   Medications:  Prescriptions prior to admission  Medication Sig Dispense Refill  . Acetaminophen (TYLENOL PO) Take 1 tablet by mouth every 6 (six) hours as needed. For pain       . furosemide (LASIX) 20 MG tablet Take 20 mg by mouth daily.        Marland Kitchen GLUCOSAMINE PO Take 30 mLs by mouth daily.        . memantine (NAMENDA) 10 MG tablet Take 10 mg by mouth 2 (two) times daily.        . metoprolol tartrate (LOPRESSOR) 25 MG tablet Take 12.5 mg by mouth 2 (two) times daily.        . Multiple Vitamins-Minerals (MULTIVITAMIN) LIQD Take 5 mLs by mouth daily.        Marland Kitchen warfarin (COUMADIN) 5 MG tablet Take 2.5-5 mg by mouth daily. Takes 0.5 tablet (2.5mg ) on Mondays, Wednesdays, and Fridays. Takes 1 tablet (5mg ) on remainder days.        Assessment: INR therapeutic after dose held yesterday. No bleeding noted. Suspect INR elevation may have been due to poor po intake over past few days.  Goal of Therapy:  INR 2-3   Plan:  1.  Coumadin 5mg  po today 2. F/u daily INR  Christoper Fabian, PharmD, BCPS Pager [phone]  10/27/2011,8:53 AM

## 2011-10-28 ENCOUNTER — Encounter (HOSPITAL_COMMUNITY): Payer: Self-pay | Admitting: Radiology

## 2011-10-28 LAB — HEPARIN LEVEL (UNFRACTIONATED): Heparin Unfractionated: 0.24 IU/mL — ABNORMAL LOW (ref 0.30–0.70)

## 2011-10-28 LAB — CBC
MCHC: 33.2 g/dL (ref 30.0–36.0)
Platelets: 106 10*3/uL — ABNORMAL LOW (ref 150–400)
RDW: 14 % (ref 11.5–15.5)
WBC: 9.2 10*3/uL (ref 4.0–10.5)

## 2011-10-28 LAB — BASIC METABOLIC PANEL
Calcium: 8.2 mg/dL — ABNORMAL LOW (ref 8.4–10.5)
GFR calc Af Amer: 51 mL/min — ABNORMAL LOW (ref >90)
GFR calc non Af Amer: 44 mL/min — ABNORMAL LOW (ref >90)
Potassium: 3.4 mEq/L — ABNORMAL LOW (ref 3.5–5.1)
Sodium: 139 mEq/L (ref 135–145)

## 2011-10-28 LAB — GLUCOSE, CAPILLARY
Glucose-Capillary: 121 mg/dL — ABNORMAL HIGH (ref 70–99)
Glucose-Capillary: 119 mg/dL — ABNORMAL HIGH (ref 70–99)
Glucose-Capillary: 108 mg/dL — ABNORMAL HIGH (ref 70–99)

## 2011-10-28 LAB — PROTIME-INR: INR: 1.84 — ABNORMAL HIGH (ref 0.00–1.49)

## 2011-10-28 MED ORDER — HEPARIN SOD (PORCINE) IN D5W 100 UNIT/ML IV SOLN
700.0000 [IU]/h | INTRAVENOUS | Status: DC
  Administered 2011-10-28: 700 [IU]/h via INTRAVENOUS
  Filled 2011-10-28 (×2): qty 250

## 2011-10-28 MED ORDER — HEPARIN SOD (PORCINE) IN D5W 100 UNIT/ML IV SOLN
900.0000 [IU]/h | INTRAVENOUS | Status: DC
  Administered 2011-10-28 – 2011-10-29 (×2): 800 [IU]/h via INTRAVENOUS
  Administered 2011-10-30 – 2011-11-01 (×3): 900 [IU]/h via INTRAVENOUS
  Filled 2011-10-28 (×6): qty 250

## 2011-10-28 NOTE — Progress Notes (Signed)
ANTICOAGULATION CONSULT NOTE - Follow Up Consult  Pharmacy Consult for Heparin (Coumadin on hold) Indication: afib/h/o CVA; bridge for possible kyphoplasty  No Known Allergies  Patient Measurements: Height: 5' (152.4 cm) Weight: 119 lb 14.9 oz (54.4 kg) IBW/kg (Calculated) : 45.5    Vital Signs:    Labs:  Basename 10/28/11 0600 10/27/11 0646 10/26/11 1604 10/26/11 0840 10/26/11 0645 10/26/11 0039 10/26/11 10/25/11 1138  HGB 12.7 -- -- -- -- 12.1 -- --  HCT 38.2 -- -- -- -- 35.8* -- 42.0  PLT 106* -- -- -- -- 128* -- --  APTT -- -- -- -- -- -- -- --  LABPROT 21.6* 26.9* -- -- 33.9* -- -- --  INR 1.84* 2.44* -- -- 3.28* -- -- --  HEPARINUNFRC -- -- -- -- -- -- -- --  CREATININE -- -- -- -- 1.13* -- -- 1.40*  CKTOTAL -- -- 1462* 2184* -- -- 2819* --  CKMB -- -- 13.2* 22.1* -- -- 35.7* --  TROPONINI -- -- <0.30 <0.30 -- -- <0.30 --   Estimated Creatinine Clearance: 24.2 ml/min (by C-G formula based on Cr of 1.13).   Medications:  Prescriptions prior to admission  Medication Sig Dispense Refill  . Acetaminophen (TYLENOL PO) Take 1 tablet by mouth every 6 (six) hours as needed. For pain       . furosemide (LASIX) 20 MG tablet Take 20 mg by mouth daily.        Marland Kitchen GLUCOSAMINE PO Take 30 mLs by mouth daily.        . memantine (NAMENDA) 10 MG tablet Take 10 mg by mouth 2 (two) times daily.        . metoprolol tartrate (LOPRESSOR) 25 MG tablet Take 12.5 mg by mouth 2 (two) times daily.        . Multiple Vitamins-Minerals (MULTIVITAMIN) LIQD Take 5 mLs by mouth daily.        Marland Kitchen warfarin (COUMADIN) 5 MG tablet Take 2.5-5 mg by mouth daily. Takes 0.5 tablet (2.5mg ) on Mondays, Wednesdays, and Fridays. Takes 1 tablet (5mg ) on remainder days.        Assessment: INR subtherapeutic today. Coumadin now on hold for possible kyphoplasty for L4 compression fx. IR to evaluate. No bleeding noted. Plan to bridge with heparin.  Goal of Therapy:  Heparin level 0.3-0.7   Plan:  1. Coumadin on  hold 2. Begin heparin gtt at 700 units/hr. No bolus 3. 8 hr heparin level 4. Daily INR, heparin level, and CBC  Christoper Fabian, PharmD, BCPS Pager [phone]  10/28/2011,9:22 AM

## 2011-10-28 NOTE — Plan of Care (Signed)
Problem: Phase II Progression Outcomes Goal: Progress activity as tolerated unless otherwise ordered Outcome: Progressing Slow, limited by pain and mentation

## 2011-10-28 NOTE — H&P (Signed)
[name] is an 77 y.o. female.   Chief Complaint: Back pain; MRI shows T11 and L4 fracture amenable to Kyphoplasty per Dr Corliss Skains HPI: Hx Afib-off coumadin now; dementia/CVA; CKD Scheduled for possible Thoracic#11 and Lumbar #4 Kyphoplasty 12/6 if INR comes down  Past Medical History  Diagnosis Date  . CHF (congestive heart failure)   . Hypertension   . Osteoporosis   . CVA (cerebral vascular accident) 2008  . Peripheral vascular disease   . Urinary incontinence   . Hyperlipidemia   . Fracture of hip   . Dementia   . Cancer of breast, female     Past Surgical History  Procedure Date  . Tonsillectomy   . Breast lumpectomy     left breast  . Orif left hip 2009  . Eye surgery     cataracts    History reviewed. No pertinent family history. Social History:  reports that she has never smoked. She does not have any smokeless tobacco history on file. She reports that she does not drink alcohol or use illicit drugs.  Allergies: No Known Allergies  Medications Prior to Admission  Medication Dose Route Frequency Provider Last Rate Last Dose  . 0.9 %  sodium chloride infusion   Intravenous Continuous Rachal A David 50 mL/hr at 10/25/11 2311    . cefTRIAXone (ROCEPHIN) 1 g in dextrose 5 % 50 mL IVPB  1 g Intravenous QHS Rolan Lipa   1 g at 10/27/11 2149  . furosemide (LASIX) tablet 20 mg  20 mg Oral Daily Duke Salvia, MD   20 mg at 10/28/11 1610  . heparin ADULT infusion 100 units/ml (25000 units/250 ml)  700 Units/hr Intravenous Continuous Hilario Quarry Amend, PHARMD      . memantine [facility]) tablet 10 mg  10 mg Oral BID Rachal A David   10 mg at 10/28/11 9604  . metoprolol (LOPRESSOR) injection 2.5 mg  2.5 mg Intravenous QID PRN Karrar Husain   2.5 mg at 10/26/11 1817  . metoprolol tartrate (LOPRESSOR) tablet 12.5 mg  12.5 mg Oral BID Duke Salvia, MD   12.5 mg at 10/28/11 5409  . sodium chloride 0.9 % bolus 500 mL  500 mL Intravenous Once  Nelia Shi, MD   500 mL at 10/25/11 1633  . warfarin (COUMADIN) tablet 5 mg  5 mg Oral ONCE-1800 Hilario Quarry Amend, PHARMD   5 mg at 10/27/11 1800  . DISCONTD: cefTRIAXone (ROCEPHIN) 1 g in dextrose 5 % 50 mL IVPB  1 g Intravenous Q24H Rachal A David      . DISCONTD: warfarin (COUMADIN) tablet 2 mg  2 mg Oral Q M,W,F-1800 Gary Fleet Abbott, PHARMD      . DISCONTD: warfarin (COUMADIN) tablet 2.5 mg  2.5 mg Oral Q M,W,F-1800 Kimberly Ballard Hammons, PHARMD      . DISCONTD: warfarin (COUMADIN) tablet 2.5-5 mg  2.5-5 mg Oral Daily Rachal A David      . DISCONTD: warfarin (COUMADIN) tablet 5 mg  5 mg Oral Q T,Th,S,Su-1800 Gary Fleet [place], [place]       No current outpatient prescriptions on file as of 10/28/2011.    Results for orders placed during the hospital encounter of 10/25/11 (from the past 48 hour(s))  CARDIAC PANEL(CRET KIN+CKTOT+MB+TROPI)     Status: Abnormal   Collection Time   10/26/11  4:04 PM      Component Value Range Comment   Total CK 1462 (*) 7 - 177 (U/L)  CK, MB 13.2 (*) 0.3 - 4.0 (ng/mL) CRITICAL VALUE NOTED.  VALUE IS CONSISTENT WITH PREVIOUSLY REPORTED AND CALLED VALUE.   Troponin I <0.30  <0.30 (ng/mL)    Relative Index 0.9  0.0 - 2.5    URINE CULTURE     Status: Normal   Collection Time   10/26/11  7:25 PM      Component Value Range Comment   Specimen Description URINE, CATHETERIZED      Special Requests Normal      Setup Time [id number]      Colony Count NO GROWTH      Culture NO GROWTH      Report Status 10/27/2011 FINAL     PROTIME-INR     Status: Abnormal   Collection Time   10/27/11  6:46 AM      Component Value Range Comment   Prothrombin Time 26.9 (*) 11.6 - 15.2 (seconds)    INR 2.44 (*) 0.00 - 1.49    GLUCOSE, CAPILLARY     Status: Abnormal   Collection Time   10/27/11  7:54 AM      Component Value Range Comment   Glucose-Capillary 102 (*) 70 - 99 (mg/dL)    Comment 1 Documented in Chart     GLUCOSE, CAPILLARY     Status: Abnormal     Collection Time   10/27/11 11:39 AM      Component Value Range Comment   Glucose-Capillary 113 (*) 70 - 99 (mg/dL)    Comment 1 Notify RN     GLUCOSE, CAPILLARY     Status: Abnormal   Collection Time   10/27/11  4:56 PM      Component Value Range Comment   Glucose-Capillary 133 (*) 70 - 99 (mg/dL)    Comment 1 Notify RN     GLUCOSE, CAPILLARY     Status: Abnormal   Collection Time   10/27/11  9:39 PM      Component Value Range Comment   Glucose-Capillary 120 (*) 70 - 99 (mg/dL)    Comment 1 Notify RN     PROTIME-INR     Status: Abnormal   Collection Time   10/28/11  6:00 AM      Component Value Range Comment   Prothrombin Time 21.6 (*) 11.6 - 15.2 (seconds)    INR 1.84 (*) 0.00 - 1.49    CBC     Status: Abnormal   Collection Time   10/28/11  6:00 AM      Component Value Range Comment   WBC 9.2  4.0 - 10.5 (K/uL)    RBC 4.09  3.87 - 5.11 (MIL/uL)    Hemoglobin 12.7  12.0 - 15.0 (g/dL)    HCT 16.1  09.6 - 04.5 (%)    MCV 93.4  78.0 - 100.0 (fL)    MCH 31.1  26.0 - 34.0 (pg)    MCHC 33.2  30.0 - 36.0 (g/dL)    RDW 40.9  81.1 - 91.4 (%)    Platelets 106 (*) 150 - 400 (K/uL) PLATELET COUNT CONFIRMED BY SMEAR   Dg Lumbar Spine 2-3 Views  10/27/2011  *RADIOLOGY REPORT*  Clinical Data: Low back pain, fell last Friday, decreased mobility  LUMBAR SPINE - 2-3 VIEW  Comparison: None.  Findings: The bones are diffusely osteopenic.  There is a partial compression deformity of L4 vertebral body which may be acute or subacute of approximately 30-40%.  No significant retropulsion is seen.  Minimal compression of the superior endplate of  L2 is noted which may be old.  The SI joints appear normal.  Considerable calcification of the lower thoracic and upper abdominal aorta is noted.  IMPRESSION:  1.  Acute or subacute compression deformity of L4 vertebral body of between 30 and 40%.  No retropulsion. 2.  Probable old mild compression of the anterosuperior aspect of L2. 3.  Diffuse osteopenia.   Original Report Authenticated By: Juline Patch, M.D.   Mr Lumbar Spine Wo Contrast  10/28/2011  *RADIOLOGY REPORT*  Clinical Data: Thoracolumbar compression fractures.  MRI LUMBAR SPINE WITHOUT CONTRAST  Technique:  Multiplanar and multiecho pulse sequences of the lumbar spine were obtained without intravenous contrast.  Comparison: 10/27/2011.  Findings: Multiple thoracolumbar vertebral compression fractures are present.  Acute or subacute T11 superior endplate compression fracture is present with no significant retropulsion.  Loss of height is between 25% and 50%.  Marrow edema is mild without fracture cleavage plane.  L2 superior endplate compression fracture is present with proximally 25% loss of vertebral body height and no retropulsion. This fracture is chronic without marrow edema.  L4 compression fractures present involving the superior endplate with slightly greater than 50% loss of several vertebral body height.  Minimal retropulsion, measuring about 2 mm.  Tiny amount of edema is present in the anterior inferior endplate which appears discogenic rather than associated with a compression fracture.  The appearance of this compression fracture is chronic.  Incidental visualization of the lung bases demonstrates opacity dependently which probably represents atelectasis and effusion. Cystic lesion is present in the interpolar region of the right kidney, probably representing cyst.  Prominence of the renal pelvis bilaterally is most compatible with extrarenal pelvis.  The there does appear be cardiomegaly.  Bilateral sacral insufficiency fractures are present, which appear acute or subacute. Per CMS PQRS reporting requirements (PQRS Measure 24): Given the patient's age of greater than 50 and the fracture site (hip, distal radius, or spine), the patient should be tested for osteoporosis using DXA, and the appropriate treatment considered based on the DXA results.  T10-T11:  Negative.  T11-T12:  Negative.   L1-L2:  Shallow disc bulging without stenosis.  L2-L3:  Mild facet hypertrophy and shallow disc bulging.  No resulting stenosis.  L3-L4:  Mild central stenosis secondary to retropulsion and broad- based disc bulge.  Mild left foraminal stenosis secondary to bulging disc and facet hypertrophy.  Small bilateral facet effusions.  L4-L5: Left eccentric shallow broad-based disc bulging.  Foramina appear adequately patent.  Lateral recesses patent.  L5-S1:  Mild facet degeneration.  Central canal and lateral recesses are patent.  IMPRESSION: 1.  Acute or subacute T11 superior endplate compression fracture with between 25 and 50% loss of vertebral body height.  No significant retropulsion. 2.  Chronic L4 and L2 compression fractures. 3.  Acute or subacute bilateral sacral insufficiency fractures. 4.  Mild lumbar spine degenerative disease.  Original Report Authenticated By: Andreas Newport, M.D.    ROS  Blood pressure 117/76, pulse 98, temperature 98.2 F (36.8 C), temperature source Oral, resp. rate 18, height 5' (1.524 m), weight 119 lb 14.9 oz (54.4 kg), SpO2 91.00%. Physical Exam  Assessment/Plan 77 yo female with low back pain for weeks. No real known injury although pt transfers from bed to chair. MRI shows T11 and L4 fractures that are amenable to Kyphoplasty or Vertebroplasty per Dr Corliss Skains. Request made to perform procedure per Dr Debby Bud. Grandaughter [facility]) is aware of procedure benefits and risks and agreeable to proceed. She is also  aware INR must be 1.5 or lower to proceed safely. We will check inr in am. Consent signed.  [name] A 10/28/2011, 9:47 AM

## 2011-10-28 NOTE — Progress Notes (Signed)
Physical Therapy Treatment Patient Details Name: [name] MRN: [id number] DOB: 1920/09/27 Today's Date: 10/28/2011  PT Assessment/Plan  PT - Assessment/Plan PT Plan: Discharge plan remains appropriate;Other (comment) (will have a better idea after she has tomorrow's procedure) PT Goals  Acute Rehab PT Goals PT Goal: Supine/Side to Sit - Progress: Progressing toward goal PT Goal: Sit to Stand - Progress: Progressing toward goal PT Goal: Stand to Sit - Progress: Progressing toward goal Pt will Transfer Bed to Chair/Chair to Bed: Other (comment) (not addressed today) PT Transfer Goal: Bed to Chair/Chair to Bed - Progress:  (not addressed today) Pt will Perform Home Exercise Program:  (not addressed today) PT Goal: Perform Home Exercise Program - Progress:  (not addressed today)  PT Treatment Precautions/Restrictions  Precautions Precautions: Fall Restrictions Weight Bearing Restrictions: No Mobility (including Balance) Bed Mobility Bed Mobility: Yes Rolling Right: 3: Mod assist;With rail Rolling Right Details (indicate cue type and reason): vc's for hand placement; tc for guiding, manual A for trunk control Right Sidelying to Sit: 3: Mod assist;HOB flat Right Sidelying to Sit Details (indicate cue type and reason): v/tc's for hand placement and guiding through the task Sitting - Scoot to Edge of Bed: 3: Mod assist Transfers Transfers: Yes Sit to Stand: 1: +2 Total assist;From bed;Patient percentage (comment);Other (comment) (pt = 50%) Sit to Stand Details (indicate cue type and reason): vc's for hand placement; manual A for forward w/shift (side stepping up to [facility] only)  Balance Balance Assessed: No Exercise    End of Session PT - End of Session Activity Tolerance: Patient limited by fatigue;Patient limited by pain Patient left: in bed;with call bell in reach;with family/visitor present Nurse Communication: Mobility status for transfers Marylene Land RN made aware that pt  worked with PT) General Behavior During Session: [facility] for tasks performed Cognition: Impaired (needed guiding and redirection for optimal participation)  [name], Eliseo Gum 10/28/2011, 3:32 PM  10/28/2011  [place] Bing, PT [phone] (pager)

## 2011-10-28 NOTE — Progress Notes (Signed)
Subjective: She has been more alert per grand-daughter. She does have discomfort when moved. Had foley placed due to poor bladder emptying.  Objective: Lab: 12/4  INR 2.44  Imaging: Lumbar spine films - L4 compression fracture acute vs subacute. MRI done - reading is pending  Physical Exam: Vitals stable Pulm - normal respirations Cor - IRIR rate controlled Abd - soft Neuro - awake, speech is barely audible     Assessment/Plan: 1. ID - Day # rocephin. No fever. CBC pending Plan- continue rocephin until [facility] in normal range  2. Card - rate controlled A. Fib  4. CKD - acute on chronic. Bmet pending  5. CHF no respiratory distress- no rales on exam. Foley in place for relief of retention and this will allow closer I&Os  6. MSK/osteoporosis - patient with L4 compression fracture. MRI done.  Plan - IR consult - possible kyphoplasty - if candidate will need to stop coumadin and bridge with lovenox.   [name] 10/28/2011, 6:17 AM

## 2011-10-29 LAB — CBC
HCT: 37.6 % (ref 36.0–46.0)
Hemoglobin: 12.3 g/dL (ref 12.0–15.0)
MCH: 30.5 pg (ref 26.0–34.0)
MCHC: 32.7 g/dL (ref 30.0–36.0)
MCV: 93.3 fL (ref 78.0–100.0)
RBC: 4.03 MIL/uL (ref 3.87–5.11)

## 2011-10-29 LAB — GLUCOSE, CAPILLARY
Glucose-Capillary: 99 mg/dL (ref 70–99)
Glucose-Capillary: 122 mg/dL — ABNORMAL HIGH (ref 70–99)

## 2011-10-29 LAB — APTT: aPTT: 110 seconds — ABNORMAL HIGH (ref 24–37)

## 2011-10-29 NOTE — Progress Notes (Signed)
ANTICOAGULATION CONSULT NOTE - Follow Up Consult  Pharmacy Consult for Heparin (Coumadin on hold) Indication: afib/h/o CVA; bridge for kyphoplasty  No Known Allergies  Patient Measurements: Height: 5' (152.4 cm) Weight: 119 lb 14.9 oz (54.4 kg) IBW/kg (Calculated) : 45.5    Vital Signs: Temp: 97.6 F (36.4 C) (12/06 0500) Temp src: Axillary (12/06 0500) BP: 134/87 mmHg (12/06 0500) Pulse Rate: 74  (12/06 0500)  Labs:  Basename 10/29/11 0547 10/28/11 1819 10/28/11 1315 10/28/11 0600 10/27/11 0646 10/26/11 1604  HGB 12.3 -- -- 12.7 -- --  HCT 37.6 -- -- 38.2 -- --  PLT 101* -- -- 106* -- --  APTT 110* -- -- -- -- --  LABPROT 23.6* -- -- 21.6* 26.9* --  INR 2.06* -- -- 1.84* 2.44* --  HEPARINUNFRC 0.60 0.24* -- -- -- --  CREATININE -- -- 1.09 -- -- --  CKTOTAL -- -- -- -- -- 1462*  CKMB -- -- -- -- -- 13.2*  TROPONINI -- -- -- -- -- <0.30   Estimated Creatinine Clearance: 25.1 ml/min (by C-G formula based on Cr of 1.09).   Medications:  Prescriptions prior to admission  Medication Sig Dispense Refill  . Acetaminophen (TYLENOL PO) Take 1 tablet by mouth every 6 (six) hours as needed. For pain       . furosemide (LASIX) 20 MG tablet Take 20 mg by mouth daily.        Marland Kitchen GLUCOSAMINE PO Take 30 mLs by mouth daily.        . memantine (NAMENDA) 10 MG tablet Take 10 mg by mouth 2 (two) times daily.        . metoprolol tartrate (LOPRESSOR) 25 MG tablet Take 12.5 mg by mouth 2 (two) times daily.        . Multiple Vitamins-Minerals (MULTIVITAMIN) LIQD Take 5 mLs by mouth daily.        Marland Kitchen warfarin (COUMADIN) 5 MG tablet Take 2.5-5 mg by mouth daily. Takes 0.5 tablet (2.5mg ) on Mondays, Wednesdays, and Fridays. Takes 1 tablet (5mg ) on remainder days.        Assessment: INR therapeutic today even though coumadin dose held yesterday for kyphoplasty for L4 compression fx. Need INR < 1.5 to do kyphoplasty per IR. No bleeding noted. On therapeutic heparin bridge.  Goal of Therapy:    Heparin level 0.3-0.7   Plan:  1. Coumadin on hold 2. Continue heparin gtt at 800 units/hr 3. Daily INR, heparin level, and CBC  Christoper Fabian, PharmD, BCPS Pager [phone]  10/29/2011,8:49 AM

## 2011-10-29 NOTE — Progress Notes (Signed)
Patient ID: [name], female   DOB: [birth year], 77 y.o.   MRN: [id number]    Pt was tentatively scheduled for T11/L4 VP/KP today in IR with Dr Corliss Skains. Although off coumadin now; INR has increased today to 2.06 (1.84 12/5) We cannot safely perform procedure at that level of INR Need INR to be 1.5 or lower to proceed. Will resume diet as pre procedure and re check INR in am RN aware---she will inform family and pt.  Tentatively rescheduled for 12/7. See orders.

## 2011-10-29 NOTE — Progress Notes (Signed)
Subjective: Easily awakened. Seems comfortable.  Objective: Lab: INR 2.6         WBC 7.4, Hgb 12.3  Imaging:   Physical Exam: Vitals stable Pulm- no increased work of breathing Cor- RRR Neuro- has very realistic dreams but seems oriented     Assessment/Plan: 1. ID - doing well.  Plan- will d/c rocephin, start ceftin 2. CArd- stable rate 4. CKD no new lab -  Plan - Bment this PM 5. CHF - stable with no signs of decompensation 6. MSK/osteoporosis - T11, L4 compression fractures. Appreciate IR consult - for Kyphoplasty when INR down - hopefully 12/7   [name] 10/29/2011, 7:27 AM

## 2011-10-30 LAB — CBC
HCT: 34.5 % — ABNORMAL LOW (ref 36.0–46.0)
MCHC: 33.9 g/dL (ref 30.0–36.0)
Platelets: 115 10*3/uL — ABNORMAL LOW (ref 150–400)
RDW: 13.9 % (ref 11.5–15.5)

## 2011-10-30 LAB — GLUCOSE, CAPILLARY
Glucose-Capillary: 90 mg/dL (ref 70–99)
Glucose-Capillary: 111 mg/dL — ABNORMAL HIGH (ref 70–99)
Glucose-Capillary: 108 mg/dL — ABNORMAL HIGH (ref 70–99)

## 2011-10-30 LAB — PROTIME-INR: Prothrombin Time: 21 seconds — ABNORMAL HIGH (ref 11.6–15.2)

## 2011-10-30 LAB — HEPARIN LEVEL (UNFRACTIONATED): Heparin Unfractionated: 0.26 IU/mL — ABNORMAL LOW (ref 0.30–0.70)

## 2011-10-30 NOTE — Progress Notes (Signed)
ANTICOAGULATION CONSULT NOTE - Follow Up Consult  Pharmacy Consult for Heparin (Coumadin on hold) Indication: afib/h/o CVA; bridge for kyphoplasty  No Known Allergies  Patient Measurements: Height: 5' (152.4 cm) Weight: 119 lb 14.9 oz (54.4 kg) IBW/kg (Calculated) : 45.5    Vital Signs: Temp: 97.9 F (36.6 C) (12/07 0500) Temp src: Oral (12/07 0500) BP: 118/79 mmHg (12/07 0500) Pulse Rate: 93  (12/07 0500)  Labs:  Basename 10/30/11 0555 10/29/11 0547 10/28/11 1819 10/28/11 1315 10/28/11 0600  HGB 11.7* 12.3 -- -- --  HCT 34.5* 37.6 -- -- 38.2  PLT 115* 101* -- -- 106*  APTT -- 110* -- -- --  LABPROT 21.0* 23.6* -- -- 21.6*  INR 1.78* 2.06* -- -- 1.84*  HEPARINUNFRC 0.26* 0.60 0.24* -- --  CREATININE -- -- -- 1.09 --  CKTOTAL -- -- -- -- --  CKMB -- -- -- -- --  TROPONINI -- -- -- -- --   Estimated Creatinine Clearance: 25.1 ml/min (by C-G formula based on Cr of 1.09).   Medications:  Prescriptions prior to admission  Medication Sig Dispense Refill  . Acetaminophen (TYLENOL PO) Take 1 tablet by mouth every 6 (six) hours as needed. For pain       . furosemide (LASIX) 20 MG tablet Take 20 mg by mouth daily.        Marland Kitchen GLUCOSAMINE PO Take 30 mLs by mouth daily.        . memantine (NAMENDA) 10 MG tablet Take 10 mg by mouth 2 (two) times daily.        . metoprolol tartrate (LOPRESSOR) 25 MG tablet Take 12.5 mg by mouth 2 (two) times daily.        . Multiple Vitamins-Minerals (MULTIVITAMIN) LIQD Take 5 mLs by mouth daily.        Marland Kitchen warfarin (COUMADIN) 5 MG tablet Take 2.5-5 mg by mouth daily. Takes 0.5 tablet (2.5mg ) on Mondays, Wednesdays, and Fridays. Takes 1 tablet (5mg ) on remainder days.        Assessment: INR 1.78. Coumadin on hold for kyphoplasty for L4 compression fx. Need INR < 1.5 to do kyphoplasty per IR. No bleeding noted. On subtherapeutic heparin bridge.  Goal of Therapy:  Heparin level 0.3-0.7   Plan:  1. Coumadin on hold 2. Increase heparin gtt to 900  units/hr 3. F/u 8 hr heparin level  Christoper Fabian, PharmD, BCPS Pager [phone]  10/30/2011,7:37 AM

## 2011-10-30 NOTE — Progress Notes (Signed)
Physical Therapy Treatment Patient Details Name: [name] MRN: [id number] DOB: September 06, 1920 Today's Date: 10/30/2011  PT Assessment/Plan  PT - Assessment/Plan Comments on Treatment Session: Family very supportive but pt with very flat affect. Affected by pain and fatigue. I will likely recommend SNF following procedure as her legs are very weak but will defer official recommendation until after surgery which will likely be Monday per nurse.  PT Plan: Discharge plan remains appropriate Follow Up Recommendations:  (depends progress following procedure) Equipment Recommended: Defer to next venue PT Goals  Acute Rehab PT Goals PT Goal: Supine/Side to Sit - Progress: Progressing toward goal PT Goal: Sit to Stand - Progress: Progressing toward goal PT Goal: Stand to Sit - Progress: Progressing toward goal PT Transfer Goal: Bed to Chair/Chair to Bed - Progress: Progressing toward goal PT Goal: Ambulate - Progress:  (not addressed today secondary to weakness & pain) PT Goal: Perform Home Exercise Program - Progress: Progressing toward goal  PT Treatment Precautions/Restrictions  Precautions Precautions: Fall Restrictions Weight Bearing Restrictions: No Mobility (including Balance) Bed Mobility Rolling Left: 3: Mod assist;With rail Rolling Left Details (indicate cue type and reason): use of pad to assist with roll and to get pt fully on side; sequencing cues throughout Right Sidelying to Sit: 3: Mod assist Right Sidelying to Sit Details (indicate cue type and reason): mod sequencing and facilitatory cues to achieve upright sitting Sitting - Scoot to Edge of Bed: 3: Mod assist Sitting - Scoot to Edge of Bed Details (indicate cue type and reason): use of pad to scoot Transfers Stand Pivot Transfers: 2: Max assist;1: +2 Total assist Stand Pivot Transfer Details (indicate cue type and reason): pt needing max facilitation under her hips to achieve upright standing (pt not fully extending  through her legs); pt also had bilateral hand held assist; +2total pt 50% to stand initially but +2totalpt40% to pivot to chair; pt relying fully on therapist to control her sit maxA    Exercise  Total Joint Exercises Ankle Circles/Pumps: AROM;Both;10 reps;Supine Heel Slides: AROM;Both;10 reps;Supine End of Session PT - End of Session Equipment Utilized During Treatment: Gait belt Activity Tolerance: Patient limited by fatigue;Patient limited by pain Patient left: in chair;with call bell in reach;with family/visitor present Nurse Communication: Mobility status for transfers General Behavior During Session: Flat affect Cognition: Impaired (needs cues for participation and to answer questions)  [facility] HELEN 10/30/2011, 12:13 PM

## 2011-10-30 NOTE — Progress Notes (Signed)
Subjective: Kyphoplasty has been delayed until 12/10 due to elevated INR.  Sitting up in the chair, awake and alert - no c/o  Objective: Lab: WBC 6.0, Hgb 11.7. INR 1.78  Imaging: no new imaging   Physical Exam: Vitals - stable - no fever, BP good pulm - normal respirations Neuro - A&O x 3, improved strength    Assessment/Plan: 1. ID - #4 antibiotics. WBC down, afebrile.  Plan - complete full 7 days 6. MSK/osteoporosis - pain controlled. A little mobility. Await IR intervention for 12/10   [name] 10/30/2011, 11:34 AM

## 2011-10-30 NOTE — Progress Notes (Signed)
Patient ID: [name], female   DOB: 09-25-1920, 77 y.o.   MRN: [id number] Patient was tentatively scheduled for T11/L4 VP/KP today; however, her PT is 21.0  And INR 1.78. Case discussed with Dr. Corliss Skains and he would like INR 1.5 or less prior to performing procedure. Will therefore postpone case for today and tentatively reschedule for 12/10. Above discussed with patient's granddaughter. Heparin infusion will need to be stopped 2 hours preprocedure on 12/10 if f/u INR within normal limits.

## 2011-10-30 NOTE — Progress Notes (Signed)
ANTICOAGULATION CONSULT NOTE - Follow Up Consult  Pharmacy Consult for heparin Indication: afib  No Known Allergies  Patient Measurements: Height: 5' (152.4 cm) Weight: 119 lb 14.9 oz (54.4 kg) IBW/kg (Calculated) : 45.5  Adjusted Body Weight:   Vital Signs: Temp: 98.5 F (36.9 C) (12/07 1435) Temp src: Oral (12/07 1435) BP: 121/80 mmHg (12/07 1435) Pulse Rate: 92  (12/07 1435)  Labs:  Basename 10/30/11 1721 10/30/11 0555 10/29/11 0547 10/28/11 1315 10/28/11 0600  HGB -- 11.7* 12.3 -- --  HCT -- 34.5* 37.6 -- 38.2  PLT -- 115* 101* -- 106*  APTT -- -- 110* -- --  LABPROT -- 21.0* 23.6* -- 21.6*  INR -- 1.78* 2.06* -- 1.84*  HEPARINUNFRC 0.60 0.26* 0.60 -- --  CREATININE -- -- -- 1.09 --  CKTOTAL -- -- -- -- --  CKMB -- -- -- -- --  TROPONINI -- -- -- -- --   Estimated Creatinine Clearance: 25.1 ml/min (by C-G formula based on Cr of 1.09).   Medications:  Scheduled:    . cefTRIAXone (ROCEPHIN)  IV  1 g Intravenous QHS  . furosemide  20 mg Oral Daily  . memantine  10 mg Oral BID  . metoprolol tartrate  12.5 mg Oral BID   Infusions:    . heparin 900 Units/hr (10/30/11 0920)    Assessment: 77 yo female with afib is currently on therapeutic heparin Goal of Therapy:  Heparin level 0.3-0.7 units/ml   Plan:  1) Continue heparin at 900 units/hr (=26ml/hr) 2) heparin level and CBC in am  Carinna Newhart, Tsz-Yin 10/30/2011,5:54 PM

## 2011-10-31 LAB — GLUCOSE, CAPILLARY: Glucose-Capillary: 118 mg/dL — ABNORMAL HIGH (ref 70–99)

## 2011-10-31 NOTE — Progress Notes (Signed)
ANTICOAGULATION CONSULT NOTE - Follow Up Consult  Pharmacy Consult for heparin Indication: afib   Assessment: 77 yo female w/ AFib (coumadin on hold for kyphoplasty on Monday). Pt's PO requests no labs today.   Last Heparin level was therapeutic at this dose. However, concerned about continuing without a level from today. Not under pharmacy protocol to discontinue heparin, so will await MD response and monitor for bleeding.  Goal of Therapy:  Heparin level 0.3-0.7 units/ml   Plan:  1) Continue heparin at 900 units/hr (=35ml/hr) 2) heparin level and CBC in am  Wyline Copas 10/31/2011,3:37 PM   No Known Allergies  Patient Measurements: Height: 5' (152.4 cm) Weight: 119 lb 14.9 oz (54.4 kg) IBW/kg (Calculated) : 45.5  Adjusted Body Weight:   Vital Signs: Temp: 97.2 F (36.2 C) (12/08 1353) Temp src: Oral (12/08 1353) BP: 122/80 mmHg (12/08 1353) Pulse Rate: 79  (12/08 1353)  Labs:  Basename 10/30/11 1721 10/30/11 0555 10/29/11 0547  HGB -- 11.7* 12.3  HCT -- 34.5* 37.6  PLT -- 115* 101*  APTT -- -- 110*  LABPROT -- 21.0* 23.6*  INR -- 1.78* 2.06*  HEPARINUNFRC 0.60 0.26* 0.60  CREATININE -- -- --  CKTOTAL -- -- --  CKMB -- -- --  TROPONINI -- -- --   Estimated Creatinine Clearance: 25.1 ml/min (by C-G formula based on Cr of 1.09).   Medications:  Scheduled:     . cefTRIAXone (ROCEPHIN)  IV  1 g Intravenous QHS  . furosemide  20 mg Oral Daily  . memantine  10 mg Oral BID  . metoprolol tartrate  12.5 mg Oral BID   Infusions:     . heparin 900 Units/hr (10/31/11 0251)

## 2011-10-31 NOTE — Progress Notes (Addendum)
Spoke with Dr Pete Glatter - on call for Dr Debby Bud.  MD informed that patient is on heparin for afib and has refused labs for today.  Dr Pete Glatter requested that a note be left for Dr Debby Bud to address in the AM and that the patient remain on heparin for now.    Thanks!  Alison L. Illene Bolus, PharmD, BCPS Clinical Pharmacist 10/31/2011 7:45 PM   ADDENDUM: can call [phone] on 12/9

## 2011-10-31 NOTE — Progress Notes (Signed)
Subjective: Patient asleep. No reason to awaken  Objective: Lab: no new lab  Imaging: no new imaging   Physical Exam: Vitals reviewed - stable No PE done - stable     Assessment/Plan: 1. ID - #5 antibiotic will d/c in AM Plan - f/u CBC 6. MSK/osteoporosis - await kyphoplasty Monday 12/10   [name] 10/31/2011, 6:54 AM

## 2011-10-31 NOTE — Progress Notes (Signed)
Rec'd call from pharmacy regarding heparin level not being drawn.  Attempted to contact MD via answering service with no answer.  Facundo Allemand, [facility]

## 2011-10-31 NOTE — Progress Notes (Addendum)
Pt due for heparin level.  Phlebotomy in this am to draw lab.  Unable to obtain sample.  Lab back to pt's beside.  Pt's POA requested lab not be collected today. Attempted to contact MD with no response from answering service. Meckenzie Balsley, [facility]

## 2011-11-01 LAB — CBC
Hemoglobin: 11.7 g/dL — ABNORMAL LOW (ref 12.0–15.0)
MCH: 30.8 pg (ref 26.0–34.0)
MCHC: 32.9 g/dL (ref 30.0–36.0)

## 2011-11-01 LAB — PROTIME-INR: INR: 1.15 (ref 0.00–1.49)

## 2011-11-01 NOTE — Progress Notes (Signed)
Patient ID: [name], female   DOB: 12/19/1919, 77 y.o.   MRN: [id number]   Pt has been tentatively scheduled for T11 and L4 Kyphoplasty Awaiting INR to normalize INR: 1.15 12/9  We will move forward with KP 12/10 am Pt and RN aware See orders.

## 2011-11-01 NOTE — Progress Notes (Signed)
ANTICOAGULATION CONSULT NOTE - Follow Up Consult  Pharmacy Consult for heparin Indication: afib   Assessment: 77 yo female w/ AFib (coumadin on hold for kyphoplasty tomorrow AM).   Heparin level = 0.5, therapeutic at 900 units/hr. No bleeding noted. CBC stable.  Goal of Therapy:  Heparin level 0.3-0.7 units/ml   Plan:  1) Continue heparin at 900 units/hr (=61ml/hr) 2) heparin level and CBC in am 3) f/u plans for anticoagulation after kyphoplasty  [name] 11/01/11 13:48   No Known Allergies  Patient Measurements: Height: 5' (152.4 cm) Weight: 119 lb 14.9 oz (54.4 kg) IBW/kg (Calculated) : 45.5  Adjusted Body Weight:   Vital Signs: Temp: 97.9 F (36.6 C) (12/09 0900) Temp src: Oral (12/09 0900) BP: 116/77 mmHg (12/09 0900) Pulse Rate: 91  (12/09 0900)  Labs:  Basename 11/01/11 0700 10/30/11 1721 10/30/11 0555  HGB 11.7* -- 11.7*  HCT 35.6* -- 34.5*  PLT 149* -- 115*  APTT -- -- --  LABPROT 14.9 -- 21.0*  INR 1.15 -- 1.78*  HEPARINUNFRC 0.50 0.60 0.26*  CREATININE -- -- --  CKTOTAL -- -- --  CKMB -- -- --  TROPONINI -- -- --   Estimated Creatinine Clearance: 25.1 ml/min (by C-G formula based on Cr of 1.09).   Medications:  Scheduled:     . furosemide  20 mg Oral Daily  . memantine  10 mg Oral BID  . metoprolol tartrate  12.5 mg Oral BID  . DISCONTD: cefTRIAXone (ROCEPHIN)  IV  1 g Intravenous QHS   Infusions:     . heparin 900 Units/hr (11/01/11 0657)

## 2011-11-01 NOTE — Progress Notes (Signed)
Subjective: Patient has been refusing lab draw. Her daughter reports that she does seem more confused and out of touch since hospitaliation.  Awakens easily. Seems calm and comfortable.  Objective: Lab: WBC 6.9, Hgb 11.7, plts 149. INR 1.15  Imaging: no new imaging   Physical Exam: Vitals -stable Gen' very elderly white woman in no distress Cor - IRIR rate controlled Pulm - normal respirations Abd - BS+, no guarding or rebound     Assessment/Plan: 1. ID - #6 antibiotics - wbc normal, no fever. Plan - D/C antibiotic  2. CAD- chronic a. Fib - very stable Plan - wil d/c tele  3. abnl CT with soft tissue changes - resolved  4. CKD II - stble, good urine output. Plan - Bmet in AM  5. CHF- diastolic - doing well with no signs of decompensation Plan- continue low dose furosemide  6. MSK/osteo - for kyphoplasty in AM. INR 1.15  Dispo - will return home with [facility] Tuesday, Dec 11th   [name] 11/01/2011, 10:55 AM

## 2011-11-02 ENCOUNTER — Encounter: Payer: Self-pay | Admitting: Internal Medicine

## 2011-11-02 ENCOUNTER — Inpatient Hospital Stay (HOSPITAL_COMMUNITY): Payer: Medicare Other

## 2011-11-02 LAB — CBC
Platelets: 172 10*3/uL (ref 150–400)
RBC: 3.74 MIL/uL — ABNORMAL LOW (ref 3.87–5.11)
WBC: 6.2 10*3/uL (ref 4.0–10.5)

## 2011-11-02 LAB — BASIC METABOLIC PANEL
BUN: 14 mg/dL (ref 6–23)
CO2: 28 mEq/L (ref 19–32)
Calcium: 9.2 mg/dL (ref 8.4–10.5)
Chloride: 104 mEq/L (ref 96–112)
Creatinine, Ser: 0.82 mg/dL (ref 0.50–1.10)
GFR calc Af Amer: 71 mL/min — ABNORMAL LOW (ref >90)
GFR calc non Af Amer: 62 mL/min — ABNORMAL LOW (ref >90)
Potassium: 3.8 mEq/L (ref 3.5–5.1)
Sodium: 140 mEq/L (ref 135–145)

## 2011-11-02 LAB — PROTIME-INR
INR: 1.1 (ref 0.00–1.49)
Prothrombin Time: 14.4 seconds (ref 11.6–15.2)

## 2011-11-02 MED ORDER — HEPARIN SOD (PORCINE) IN D5W 100 UNIT/ML IV SOLN
800.0000 [IU]/h | INTRAVENOUS | Status: DC
  Administered 2011-11-02 – 2011-11-03 (×2): 900 [IU]/h via INTRAVENOUS
  Administered 2011-11-05: 850 [IU]/h via INTRAVENOUS
  Administered 2011-11-07: 800 [IU]/h via INTRAVENOUS
  Filled 2011-11-02 (×9): qty 250

## 2011-11-02 MED ORDER — CEFAZOLIN SODIUM 1-5 GM-% IV SOLN
1.0000 g | INTRAVENOUS | Status: AC
  Filled 2011-11-02: qty 50

## 2011-11-02 MED ORDER — METOPROLOL TARTRATE 25 MG PO TABS
25.0000 mg | ORAL_TABLET | Freq: Two times a day (BID) | ORAL | Status: DC
  Administered 2011-11-02 – 2011-11-09 (×14): 25 mg via ORAL
  Filled 2011-11-02 (×17): qty 1

## 2011-11-02 NOTE — Progress Notes (Signed)
ANTICOAGULATION CONSULT NOTE - Follow Up Consult  Pharmacy Consult for heparin  Indication: afib  No Known Allergies  Patient Measurements: Height: 5' (152.4 cm) Weight: 119 lb 14.9 oz (54.4 kg) IBW/kg (Calculated) : 45.5   Vital Signs: Temp: 97.3 F (36.3 C) (12/10 0540) BP: 146/84 mmHg (12/10 1134) Pulse Rate: 102  (12/10 1134)  Labs:  Basename 11/02/11 0820 11/01/11 0700 10/30/11 1721  HGB 11.6* 11.7* --  HCT 35.2* 35.6* --  PLT 172 149* --  APTT 70* -- --  LABPROT 14.4 14.9 --  INR 1.10 1.15 --  HEPARINUNFRC 0.44 0.50 0.60  CREATININE 0.82 -- --  CKTOTAL -- -- --  CKMB -- -- --  TROPONINI -- -- --   Estimated Creatinine Clearance: 33.4 ml/min (by C-G formula based on Cr of 0.82).   Assessment: 89 YOF  on heparin infusion for h/o Afib (coumadin PTA), Planed for kyphoplasty this AM and heparin was on hold, but procedure was delayed d/t pt. Had runs of V-tach. Now plan to restart heparin infusion, likely for kyphoplasty tomorrow.   Heparin level within therapeutic range this am before infusion was held   Goal of Therapy:  Heparin level 0.3-0.7 units/ml   Plan:  1) restart heparin at 900 units/hr 2) f/u heparin level tomorrow am and plans for anticoagulation after kyphoplasty   Riki Rusk 11/02/2011,11:47 AM

## 2011-11-02 NOTE — Progress Notes (Signed)
Subjective: [name] is awake and in no acute distress. She is disappointed that she did not get her procedures today. She has had no chest pain or shortness of breath. She is still having a lot of back pain that is limiting her activity  Objective: Lab:WBC 6.2, Hgb 11.6, Plts  172        K 3.8, Cr 0.82  Imaging: no new imaging   Physical Exam: Vitals stable w/ HR 87 Gen'l - elderly white woman in no distress Chest- good breath sounds Cor - IRIR rate controlled Abd - soft,      Assessment/Plan: 1. ID - completed antibiotics. No fever. 2. CAD - chronic a fib with occasional RVR. She had HR 130-14 today in procedure area, mostlikely driven by pain and manipulation. By report she had short run(s) of V-tach. Plan - lopressor was increased to 25 mg bid to help control RVR 4. CKD- creatinine is very good 6. MSK/osteo - patient did not get kyphoplasty today due to rapid a. Fib. Discussed with Dr. Algis Downs - PLan - will try for kyphoplasty tomorrow. It will help the patient to have her grand daughter be with her to offer reassurance and limit rate driving anxiety.   [name] 11/02/2011, 6:35 PM

## 2011-11-02 NOTE — ED Notes (Addendum)
Pt brought down for a KP.  Pt placed prone and monitor attached.  HR 120-140's.  Pt having runs of V-tach.  B/P 140/90-111.  Dr Corliss Skains  Notified of pt's condition and does not want to proceed with procedure. Gypsy Balsam RN verified runs of [employer].  Farrah RN on [phone] notified of pt's condition and states pt isn't on tele and doesn't know of pt having any V-tach.  Otelia Santee will notify Dr. Debby Bud of pt's condition and have him page Dr. Corliss Skains.  Pt taken back to her room

## 2011-11-02 NOTE — Progress Notes (Signed)
Dr. Debby Bud notified of patient's kyphoplasty procedure not being done per Interventional Radiology report and note.  MD to call and speak with Interventionalist Dr. Titus Dubin.  Will await return call from Dr. Debby Bud.  Will continue to monitor.  Colman Cater

## 2011-11-03 ENCOUNTER — Inpatient Hospital Stay (HOSPITAL_COMMUNITY): Payer: Medicare Other

## 2011-11-03 ENCOUNTER — Other Ambulatory Visit: Payer: Self-pay | Admitting: Interventional Radiology

## 2011-11-03 LAB — CBC
MCH: 31.1 pg (ref 26.0–34.0)
MCHC: 33.1 g/dL (ref 30.0–36.0)
MCV: 94.1 fL (ref 78.0–100.0)
Platelets: 169 10*3/uL (ref 150–400)
RDW: 14.5 % (ref 11.5–15.5)
WBC: 6.9 10*3/uL (ref 4.0–10.5)

## 2011-11-03 LAB — PROTIME-INR: Prothrombin Time: 14 seconds (ref 11.6–15.2)

## 2011-11-03 MED ORDER — FENTANYL CITRATE 0.05 MG/ML IJ SOLN
INTRAMUSCULAR | Status: DC | PRN
  Administered 2011-11-03: 25 ug via INTRAVENOUS

## 2011-11-03 MED ORDER — WARFARIN SODIUM 5 MG PO TABS
5.0000 mg | ORAL_TABLET | Freq: Once | ORAL | Status: AC
  Administered 2011-11-03: 5 mg via ORAL
  Filled 2011-11-03: qty 1

## 2011-11-03 MED ORDER — CEFAZOLIN SODIUM 1-5 GM-% IV SOLN
1.0000 g | INTRAVENOUS | Status: AC
  Administered 2011-11-03: 1 g via INTRAVENOUS
  Filled 2011-11-03: qty 50

## 2011-11-03 MED ORDER — LORAZEPAM 2 MG/ML IJ SOLN
INTRAMUSCULAR | Status: DC | PRN
  Administered 2011-11-03: 1 mg via INTRAVENOUS

## 2011-11-03 MED ORDER — SODIUM CHLORIDE 0.9 % IV SOLN
INTRAVENOUS | Status: AC
  Administered 2011-11-03: 14:00:00 via INTRAVENOUS

## 2011-11-03 NOTE — Progress Notes (Signed)
ANTICOAGULATION CONSULT NOTE - Follow Up Consult  Pharmacy Consult for   Heparin and Coumadin Indication: atrial fibrillation  No Known Allergies  Patient Measurements: Height: 5' (152.4 cm) Weight: 119 lb 14.9 oz (54.4 kg) IBW/kg (Calculated) : 45.5   Labs:  Basename 11/03/11 2130 11/03/11 0540 11/02/11 0820 11/01/11 0700  HGB -- 11.7* 11.6* --  HCT -- 35.4* 35.2* 35.6*  PLT -- 169 172 149*  APTT -- -- 70* --  LABPROT -- 14.0 14.4 14.9  INR -- 1.06 1.10 1.15  HEPARINUNFRC 0.53 0.46 0.44 --  CREATININE -- -- 0.82 --  CKTOTAL -- -- -- --  CKMB -- -- -- --  TROPONINI -- -- -- --   Estimated Creatinine Clearance: 33.4 ml/min (by C-G formula based on Cr of 0.82).  Assessment:    Heparin resumed this afternoon s/p kyphoplasty.    Level tonight on 900 units/hr is 0.53 = therapeutic.    Coumadin to resume tonight.   Last dose 12/4.    INR back to baseline today.    Coumadin regimen on admission: 2.5 mg MWF; 5 mg TTSS  Goal of Therapy:  Heparin level 0.3-0.7 units/ml INR 2-3   Plan:     Continue Heparin drip at 900 units/hr.    Coumadin 5 mg tonight.    Daily Heparin level, CBC and PT/INR.  Dennie Fetters Pager: [phone] 11/03/2011,10:48 PM

## 2011-11-03 NOTE — Progress Notes (Signed)
Subjective: S/p kyphoplasty T11, evidently L4 was old and healed. Pt did well. She has had less pain. Family reports that she has been more perky. No new c/o  Objective: Lab: WBC 69, Hgb 11.7  Imaging: kyphoplasty films   Physical Exam: Vitals stable-good BP Awke and alert Cor- 2+ radial and IRIR rate controlled. PUlm - no infreased WOB Abd- soft     Assessment/Plan: 6. MSK/osteo - s/p kyphoplasty and already less pain. Plan- PT to evaluate for ability to manage at home with home health PT  2. Chronic a. Fib - restart coumadin    [name] 11/03/2011, 7:47 PM

## 2011-11-03 NOTE — Progress Notes (Signed)
ANTICOAGULATION CONSULT NOTE - Follow Up Consult  Pharmacy Consult for heparin  Indication: afib  Patient Measurements: Height: 5' (152.4 cm) Weight: 119 lb 14.9 oz (54.4 kg) IBW/kg (Calculated) : 45.5   Labs:  Basename 11/03/11 0540 11/02/11 0820 11/01/11 0700  HGB 11.7* 11.6* --  HCT 35.4* 35.2* 35.6*  PLT 169 172 149*  APTT -- 70* --  LABPROT 14.0 14.4 14.9  INR 1.06 1.10 1.15  HEPARINUNFRC 0.46 0.44 0.50  CREATININE -- 0.82 --  CKTOTAL -- -- --  CKMB -- -- --  TROPONINI -- -- --    Assessment: 89 YOF on heparin infusion for Afib. Heparin infusion stopped at 0811 this am for kyphoplasty. Heparin level therapeutic before gtt was held.   Goal of Therapy:  Heparin level 0.3-0.7 units/ml   Plan:  We will f/u plans for anticoagulation after procedure.  Riki Rusk 11/03/2011,9:14 AM

## 2011-11-03 NOTE — Procedures (Signed)
S/P kyphoplasty for T11 painful compression fracture. No acute complications. Full report to follow.

## 2011-11-03 NOTE — Progress Notes (Signed)
Physical Therapy Treatment Patient Details Name: [name] MRN: [id number] DOB: 07/27/1920 Today's Date: 11/03/2011  Per Nsg pt to go for Kyphoplasty today.  Will hold PT today.    Sunny Schlein,  [phone] 11/03/2011, 10:02 AM

## 2011-11-04 LAB — CBC
HCT: 36 % (ref 36.0–46.0)
Hemoglobin: 11.7 g/dL — ABNORMAL LOW (ref 12.0–15.0)
MCH: 31.1 pg (ref 26.0–34.0)
MCHC: 32.5 g/dL (ref 30.0–36.0)
MCV: 95.7 fL (ref 78.0–100.0)

## 2011-11-04 MED ORDER — ACETAMINOPHEN 500 MG PO TABS
500.0000 mg | ORAL_TABLET | Freq: Four times a day (QID) | ORAL | Status: DC | PRN
  Filled 2011-11-04: qty 1

## 2011-11-04 MED ORDER — WARFARIN SODIUM 5 MG PO TABS
5.0000 mg | ORAL_TABLET | Freq: Once | ORAL | Status: AC
  Administered 2011-11-04: 5 mg via ORAL
  Filled 2011-11-04: qty 1

## 2011-11-04 MED FILL — Midazolam HCl Inj 2 MG/2ML (Base Equivalent): INTRAMUSCULAR | Qty: 2 | Status: AC

## 2011-11-04 MED FILL — Tobramycin Sulfate For Inj 1.2 GM: INTRAMUSCULAR | Qty: 1 | Status: AC

## 2011-11-04 NOTE — Progress Notes (Signed)
Discussed in the long length of stay meeting [name] Weeks 11/04/2011  

## 2011-11-04 NOTE — Progress Notes (Signed)
Physical Therapy Re-Evaluation Patient Details Name: [name] MRN: [id number] DOB: 08/28/1920 Today's Date: 11/04/2011  PT Assessment/Plan  PT - Assessment/Plan Comments on Treatment Session: Granddaughter would really like to take her home at discharge if she can get strong enough to stand and take a few steps with only one person assisting.  She would still be a SNF candidate, but if family wants to take her home then max [facility] services recommended.  HHPT/OT/aide.  A re-eval was performed today since patient is now s/p kyphoplasty.  This seems to have had a significant reduction in her pain level.   PT Plan: Discharge plan needs to be updated;Frequency needs to be updated PT Frequency: Min 5X/week Follow Up Recommendations: Home health PT (if she can get a little stronger, otherwise SNF) Equipment Recommended: None recommended by PT PT Goals  Acute Rehab PT Goals PT Goal Formulation: With patient/family Time For Goal Achievement: 2 weeks Pt will Roll Supine to Right Side: with min assist PT Goal: Rolling Supine to Right Side - Progress: Not met Pt will Roll Supine to Left Side: with min assist PT Goal: Rolling Supine to Left Side - Progress: Not met Pt will go Supine/Side to Sit: with min assist;with HOB 0 degrees PT Goal: Supine/Side to Sit - Progress: Not met Pt will go Sit to Stand: with min assist PT Goal: Sit to Stand - Progress: Not met Pt will go Stand to Sit: with min assist PT Goal: Stand to Sit - Progress: Not met Pt will Transfer Bed to Chair/Chair to Bed: with min assist PT Transfer Goal: Bed to Chair/Chair to Bed - Progress: Not met Pt will Ambulate: 16 - 50 feet;with min assist;with rolling walker PT Goal: Ambulate - Progress: Not met  PT Treatment Precautions/Restrictions  Precautions Precautions: Fall Required Braces or Orthoses: No Restrictions Weight Bearing Restrictions: No Mobility (including Balance) Bed Mobility Rolling Left: 2: Max assist Rolling  Left Details (indicate cue type and reason): max assit to help patient turn her trunk and hips with knees bent.  Max assist to help position legs.  Patient not processing cues very well.   Left Sidelying to Sit: 2: Max assist Left Sidelying to Sit Details (indicate cue type and reason): max assist to boost upper body up to upright sitting.  Pateint trying to use her upper extremities to help.  Assist needed of both legs and trunk.   Sitting - Scoot to Edge of Bed: 2: Max assist Sitting - Scoot to [place] of Bed Details (indicate cue type and reason): max assist to weight shift hips to EOB.  Patient helping to stabilize her trunk to prevent falling with both arms.   Transfers Sit to Stand: 1: +2 Total assist;Patient percentage (comment);From elevated surface;From bed;With upper extremity assist;From toilet Sit to Stand Details (indicate cue type and reason): Patient 50% from high bed for her short legs.  The pateint needed assistance to support trunk over weak legs and to anteriorly weight shift forward over feet.  Verbal cues for upright posture and to move hands forward on RW.   Stand Pivot Transfers: 1: +2 Total assist;Patient percentage (comment);From elevated surface;With armrests Stand Pivot Transfer Details (indicate cue type and reason): stand pivot to recliner from 3-in-1 to patient's left side.  Patient 60% with continued posterior lean during the transfer with the RW.   Ambulation/Gait Ambulation/Gait: Yes Ambulation/Gait Assistance: 3: Mod assist Ambulation/Gait Assistance Details (indicate cue type and reason): mod assist to support trunk over weak legs, help anterior weight  shift patient's trunk and to help steer RW.   Ambulation Distance (Feet): 6 Feet (3' x 2) Assistive device: Rolling walker Gait Pattern: Trunk flexed;Shuffle;Step-to pattern;Decreased stride length  Balance Balance Assessed: Yes Static Sitting Balance Static Sitting - Balance Support: Bilateral upper extremity  supported;Feet supported Static Sitting - Level of Assistance: 4: Min assist Static Sitting - Comment/# of Minutes: min assist needed to prevent posterior LOB secondary to posterior lean in sitting.   Exercise    End of Session PT - End of Session Equipment Utilized During Treatment: Gait belt (RW) Activity Tolerance: Patient limited by fatigue Patient left: in chair;with call bell in reach;with family/visitor present (granddaughter assisting with treatment) General Behavior During Session: [facility] for tasks performed Cognition: Impaired Cognitive Impairment: either patient is HOH, slow to process or both.  She did better following cues and commands once standing, but to get to the EOB she did not follow any cues.    Rollene Rotunda Yuliya Nova, PT, DPT [phone]   11/04/2011, 12:14 PM

## 2011-11-04 NOTE — Plan of Care (Signed)
Problem: Phase III Progression Outcomes Goal: Foley discontinued Outcome: Not Progressing FOLEY CATH IN PLACE FOR URINARY RETENTION

## 2011-11-04 NOTE — Progress Notes (Signed)
ANTICOAGULATION CONSULT NOTE - Follow Up Consult  Pharmacy Consult for   Heparin and Coumadin Indication: atrial fibrillation  No Known Allergies  Patient Measurements: Height: 5' (152.4 cm) Weight: 119 lb 14.9 oz (54.4 kg) IBW/kg (Calculated) : 45.5   Labs:  Basename 11/04/11 0615 11/03/11 2130 11/03/11 0540 11/02/11 0820  HGB 11.7* -- 11.7* --  HCT 36.0 -- 35.4* 35.2*  PLT 174 -- 169 172  APTT -- -- -- 70*  LABPROT 14.5 -- 14.0 14.4  INR 1.11 -- 1.06 1.10  HEPARINUNFRC 0.67 0.53 0.46 --  CREATININE -- -- -- 0.82  CKTOTAL -- -- -- --  CKMB -- -- -- --  TROPONINI -- -- -- --   Estimated Creatinine Clearance: 33.4 ml/min (by C-G formula based on Cr of 0.82).  Assessment:    Heparin level this am trending up.  Will dec heparin slightly to 850 units/hr.    Coumadin 5 mg today     F/u am labs Goal of Therapy:  Heparin level 0.3-0.7 units/ml INR 2-3   Plan:     Decrease heparin drip to 850 units/hr.    Coumadin 5 mg tonight.    Daily Heparin level, CBC and PT/INR.  Woodfin Ganja Pager: [phone] 11/04/2011,9:06 AM

## 2011-11-04 NOTE — Progress Notes (Signed)
Subjective: Per RN she had a good night. Slept well, took her medications. Pharmacy has restarted coumadin.  Bright and alert. No c/o  Objective: Lab: no new labs available  Imaging:no new imaging   Physical Exam: Vitals stable Cor - 2+ radial pulse, IRIR rate controlled Pulm- normal respirations Abd - soft, BS+ Neuro - A&O x 3    Assessment/Plan: 2. Cardiac - a. Fib - rate controlled. BP is stable. She is tolerating increased dose of lopressor. Plan- for d/c when INR therapeutic  6. MSK/osteoporosis - POD #1 kyphoplasty T11. Had reduction in pain immediately. Plan- PT to continue - determine home needs  Dispo - hopefully home soon when INR therapeutic and PT eval completed.   Casimiro Needle Fontaine Hehl 11/04/2011, 7:11 AM

## 2011-11-05 LAB — CBC
Hemoglobin: 11.6 g/dL — ABNORMAL LOW (ref 12.0–15.0)
MCH: 30.9 pg (ref 26.0–34.0)
MCV: 95.7 fL (ref 78.0–100.0)
RBC: 3.76 MIL/uL — ABNORMAL LOW (ref 3.87–5.11)
WBC: 6.6 10*3/uL (ref 4.0–10.5)

## 2011-11-05 MED ORDER — WARFARIN SODIUM 5 MG PO TABS
5.0000 mg | ORAL_TABLET | Freq: Once | ORAL | Status: AC
  Administered 2011-11-05: 5 mg via ORAL
  Filled 2011-11-05: qty 1

## 2011-11-05 NOTE — Progress Notes (Signed)
ANTICOAGULATION CONSULT NOTE - Follow Up Consult  Pharmacy Consult for coumadin Indication: atrial fibrillation  No Known Allergies  Patient Measurements: Height: 5' (152.4 cm) Weight: 122 lb 11.2 oz (55.656 kg) IBW/kg (Calculated) : 45.5  Adjusted Body Weight:   Vital Signs: Temp: 98.6 F (37 C) (12/13 0500) Temp src: Oral (12/13 0500) BP: 120/63 mmHg (12/13 0956) Pulse Rate: 90  (12/13 0956)  Labs:  Basename 11/05/11 0544 11/04/11 0615 11/03/11 2130 11/03/11 0540  HGB 11.6* 11.7* -- --  HCT 36.0 36.0 -- 35.4*  PLT 186 174 -- 169  APTT -- -- -- --  LABPROT 16.2* 14.5 -- 14.0  INR 1.27 1.11 -- 1.06  HEPARINUNFRC 0.84* 0.67 0.53 --  CREATININE -- -- -- --  CKTOTAL -- -- -- --  CKMB -- -- -- --  TROPONINI -- -- -- --   Estimated Creatinine Clearance: 36.4 ml/min (by C-G formula based on Cr of 0.82).   Medications:  Scheduled:    . furosemide  20 mg Oral Daily  . memantine  10 mg Oral BID  . metoprolol tartrate  25 mg Oral BID  . warfarin  5 mg Oral ONCE-1800  . warfarin  5 mg Oral ONCE-1800   Infusions:    . heparin 800 Units/hr (11/05/11 1610)    Assessment: 77 yo female with hx of afib is currently on subtherapeutic coumadin bridging with heparin Goal of Therapy:  INR 2-3   Plan:  1) Coumadin 5mg  po x1; INR in am  Makesha Belitz, Tsz-Yin 11/05/2011,12:41 PM

## 2011-11-05 NOTE — Progress Notes (Signed)
ANTICOAGULATION CONSULT NOTE - Follow Up Consult  Pharmacy Consult for heparin Indication: atrial fibrillation  No Known Allergies  Patient Measurements: Height: 5' (152.4 cm) Weight: 122 lb 11.2 oz (55.656 kg) IBW/kg (Calculated) : 45.5   Vital Signs: Temp: 98.6 F (37 C) (12/13 0500) Temp src: Oral (12/13 0500) BP: 128/93 mmHg (12/13 0500) Pulse Rate: 91  (12/13 0500)  Labs:  Basename 11/05/11 0544 11/04/11 0615 11/03/11 2130 11/03/11 0540 11/02/11 0820  HGB 11.6* 11.7* -- -- --  HCT 36.0 36.0 -- 35.4* --  PLT 186 174 -- 169 --  APTT -- -- -- -- 70*  LABPROT 16.2* 14.5 -- 14.0 --  INR 1.27 1.11 -- 1.06 --  HEPARINUNFRC 0.84* 0.67 0.53 -- --  CREATININE -- -- -- -- 0.82  CKTOTAL -- -- -- -- --  CKMB -- -- -- -- --  TROPONINI -- -- -- -- --   Estimated Creatinine Clearance: 36.4 ml/min (by C-G formula based on Cr of 0.82).   Medications:  Scheduled:    . furosemide  20 mg Oral Daily  . memantine  10 mg Oral BID  . metoprolol tartrate  25 mg Oral BID  . warfarin  5 mg Oral ONCE-1800   Infusions:    . heparin 850 Units/hr (11/05/11 0551)    Assessment: 77yo female now supratherapeutic on heparin with levels steadily increasing-->?accumulation.  Goal of Therapy:  Heparin level 0.3-0.7 units/ml   Plan:  Will decrease heparin gtt by 1 unit/kg/hr to 800 units/hr and check level in 6hr.  Colleen Can PharmD BCPS 11/05/2011,6:37 AM

## 2011-11-05 NOTE — Progress Notes (Signed)
Physical Therapy Treatment Patient Details Name: [name] MRN: [id number] DOB: 1920-05-19 Today's Date: 11/05/2011  PT Assessment/Plan  PT - Assessment/Plan Comments on Treatment Session: Granddaughter still wanting pt to return home, however still requiring +2 A for mobility and may require St-SNF at D/C to increase mobility and decrease burden of care.   PT Plan: Discharge plan remains appropriate;Frequency remains appropriate PT Frequency: Min 5X/week Follow Up Recommendations: 24 hour supervision/assistance;Home health PT Equipment Recommended: None recommended by PT PT Goals  Acute Rehab PT Goals PT Goal: Rolling Supine to Right Side - Progress: Progressing toward goal PT Goal: Rolling Supine to Left Side - Progress: Progressing toward goal PT Goal: Supine/Side to Sit - Progress: Progressing toward goal PT Goal: Sit to Stand - Progress: Progressing toward goal PT Goal: Stand to Sit - Progress: Progressing toward goal PT Transfer Goal: Bed to Chair/Chair to Bed - Progress: Progressing toward goal PT Goal: Ambulate - Progress: Progressing toward goal PT Goal: Perform Home Exercise Program - Progress: Progressing toward goal  PT Treatment Precautions/Restrictions  Precautions Precautions: Fall Required Braces or Orthoses: No Restrictions Weight Bearing Restrictions: No Mobility (including Balance) Bed Mobility Bed Mobility: Yes Rolling Right: 3: Mod assist;With rail Rolling Right Details (indicate cue type and reason): hand over hand cues for use of UEs and movement through rotation Right Sidelying to Sit: 3: Mod assist Right Sidelying to Sit Details (indicate cue type and reason): cues for encouragement and use of UEs  Sitting - Scoot to Edge of Bed: 2: Max assist Transfers Transfers: Yes Sit to Stand: 1: +2 Total assist;From chair/3-in-1;With upper extremity assist (pt 30%) Sit to Stand Details (indicate cue type and reason): cueing for use of UEs, extension of Bil  knees and trunk Stand to Sit: 1: +2 Total assist;Patient percentage (comment);To chair/3-in-1;With upper extremity assist (pt 20%) Stand to Sit Details: cues to control descent and use UEs Stand Pivot Transfers: 1: +2 Total assist;Patient percentage (comment) (pt 20%) Stand Pivot Transfer Details (indicate cue type and reason): max cueing for safe technique, Bil LE and trunk extension.   Ambulation/Gait Ambulation/Gait: No    Exercise    End of Session PT - End of Session Equipment Utilized During Treatment: Gait belt Activity Tolerance: Patient limited by fatigue Patient left: in chair;with call bell in reach;with family/visitor present Nurse Communication: Mobility status for transfers General Behavior During Session: [facility] for tasks performed Cognition: Impaired Cognitive Impairment: either patient is HOH, slow to process or both.  She did better following cues and commands once standing, but to get to the EOB she did not follow any cues.    Sunny Schlein, [place] [phone] 11/05/2011, 12:58 PM

## 2011-11-05 NOTE — Progress Notes (Signed)
ANTICOAGULATION CONSULT NOTE - Follow Up Consult  Pharmacy Consult for heparin  Indication: atrial fibrillation  No Known Allergies  Patient Measurements: Height: 5' (152.4 cm) Weight: 122 lb 11.2 oz (55.656 kg) IBW/kg (Calculated) : 45.5   Vital Signs: Temp: 98.5 F (36.9 C) (12/13 1400) Temp src: Oral (12/13 1400) BP: 121/78 mmHg (12/13 1400) Pulse Rate: 81  (12/13 1400)  Labs:  Basename 11/05/11 1535 11/05/11 0544 11/04/11 0615 11/03/11 0540  HGB -- 11.6* 11.7* --  HCT -- 36.0 36.0 35.4*  PLT -- 186 174 169  APTT -- -- -- --  LABPROT -- 16.2* 14.5 14.0  INR -- 1.27 1.11 1.06  HEPARINUNFRC 0.60 0.84* 0.67 --  CREATININE -- -- -- --  CKTOTAL -- -- -- --  CKMB -- -- -- --  TROPONINI -- -- -- --   Estimated Creatinine Clearance: 36.4 ml/min (by C-G formula based on Cr of 0.82).   Assessment: 77 yo female with hx of afib is currently on subtherapeutic coumadin bridging with heparin. Heparin level within therapeutic range after rate adjustment this morning.  Goal of Therapy:  Heparin level 0.3-0.7 units/ml   Plan:  Continue heparin infusion at 800 units/hr We will f/u heparin level tomorrow am  Riki Rusk 11/05/2011,4:49 PM

## 2011-11-05 NOTE — Progress Notes (Signed)
Pt currently in bed with eyes closed easily arrousable by touch and sound. Pt alert and oriented to self but confused to time and place. Pt daughter currently at bedside providing some information and helping with assessment. On RA tolerating well, verbal denies any pain discomfort to this time. Bilateral radial and pedal pulses felt at +1. Lung sounds clear bilaterally. PIV to Rt FA CDI, with transparent dressing in place infusing. Dressing to backside CDI no drainage noted. Pt denies any discomfort to that area. Pt appears calm at this time, no apparent problems or concerns noted. Assessment completed.  

## 2011-11-05 NOTE — Progress Notes (Signed)
Subjective: PT note reviewed. Discussed recommendations with the patient and her grandaughter. It appears she is on track for being able to return home. She has much less pain. The barrier to discharge at this time is her coagulation status.  Objective: Lab: WBC nl, Hgb stable, INR 1.27  Imaging: no new imaging   Physical Exam: Vitals reviwed and stable PUlm - normal respirations' Neuro - awake and alert     Assessment/Plan: 6. Msdk,osteo - POD#3 from kyphoplasty and doing well.  Plan- daily PT while in-pt.          Will return home withHH PT if all goes well  2. Cardiac - rate controlled a. Fib. ON coumadin but not therapeutic Plan - per pharmacy - wint INR 2+ will be able to come off heparin.   [name] 11/05/2011, 6:58 AM

## 2011-11-05 NOTE — Initial Assessments (Signed)
Pt currently in bed with eyes closed easily arrousable by touch and sound. Pt alert and oriented to self but confused to time and place. Pt daughter currently at bedside providing some information and helping with assessment. On RA tolerating well, verbal denies any pain discomfort to this time. Bilateral radial and pedal pulses felt at +1. Lung sounds clear bilaterally. PIV to Rt FA CDI, with transparent dressing in place infusing. Dressing to backside CDI no drainage noted. Pt denies any discomfort to that area. Pt appears calm at this time, no apparent problems or concerns noted. Assessment completed.

## 2011-11-06 LAB — CBC
HCT: 34.7 % — ABNORMAL LOW (ref 36.0–46.0)
Hemoglobin: 11.4 g/dL — ABNORMAL LOW (ref 12.0–15.0)
MCHC: 32.9 g/dL (ref 30.0–36.0)
Platelets: 189 10*3/uL (ref 150–400)
WBC: 5.7 10*3/uL (ref 4.0–10.5)

## 2011-11-06 LAB — PROTIME-INR: INR: 1.37 (ref 0.00–1.49)

## 2011-11-06 LAB — HEPARIN LEVEL (UNFRACTIONATED): Heparin Unfractionated: 0.61 IU/mL (ref 0.30–0.70)

## 2011-11-06 MED ORDER — WARFARIN SODIUM 7.5 MG PO TABS
7.5000 mg | ORAL_TABLET | Freq: Once | ORAL | Status: AC
  Administered 2011-11-06: 7.5 mg via ORAL
  Filled 2011-11-06: qty 1

## 2011-11-06 NOTE — Progress Notes (Signed)
Subjective: Awake and has no complaints. Reports thather back is less painful. Reviewed PT notes - she is making progress but still requires 2+ assist. Her grand-daughter still plans to take her home.  Objective: Lab:INR 1.37 Imaging: no imaging   Physical Exam: Vitals stable Gen'l - elderly woman lying in bed in no distress Pulm - normal respirations Cor - IRIR rate controlled     Assessment/Plan: 2. Cardiac - stable a. Fib. Barrier to discharge is coagulation - need INR 2.0 or greater 6. Msk/osteoporosis - POD #3 ( done on the 11th) Plan - continue PT  Home when INR therapeutic   [name] 11/06/2011, 8:56 AM

## 2011-11-06 NOTE — Progress Notes (Signed)
ANTICOAGULATION CONSULT NOTE - Follow Up Consult  Pharmacy Consult for Heparin and Coumdin Indication: atrial fibrillation  No Known Allergies  Patient Measurements: Height: 5' (152.4 cm) Weight: 127 lb 1.6 oz (57.652 kg) IBW/kg (Calculated) : 45.5  Adjusted Body Weight:   Vital Signs: Temp: 98.4 F (36.9 C) (12/14 0500) Temp src: Oral (12/14 0500) BP: 126/84 mmHg (12/14 0941) Pulse Rate: 101  (12/14 0941)  Labs:  Basename 11/06/11 0624 11/05/11 1535 11/05/11 0544 11/04/11 0615  HGB 11.4* -- 11.6* --  HCT 34.7* -- 36.0 36.0  PLT 189 -- 186 174  APTT -- -- -- --  LABPROT 17.1* -- 16.2* 14.5  INR 1.37 -- 1.27 1.11  HEPARINUNFRC 0.61 0.60 0.84* --  CREATININE -- -- -- --  CKTOTAL -- -- -- --  CKMB -- -- -- --  TROPONINI -- -- -- --   Estimated Creatinine Clearance: 37 ml/min (by C-G formula based on Cr of 0.82).   Medications:  Heparin 800 units/hr  Assessment: 89yof on Heparin bridging to Coumadin for Afib. Heparin level (0.61) is therapeutic. INR (1.37) is still subtherapeutic and responding slowly to 5mg  x 3. Will increase dose to 7.5mg  to get the INR moving and f/u AM INR.  - H/H and Plts stable - No bleeding reported  Goal of Therapy:  INR 2-3 Heparin level 0.3-0.7 units/ml   Plan:  1. Coumadin 7.5mg  po x 1 2. Continue heparin 800 units/hr (8 ml/hr) 3. Follow-up AM INR and heparin level  Cleon Dew [phone] 11/06/2011,11:00 AM

## 2011-11-06 NOTE — Progress Notes (Signed)
Physical Therapy Treatment Patient Details Name: [name] MRN: [id number] DOB: [birth year] Today's Date: 11/06/2011  PT Assessment/Plan  PT - Assessment/Plan Comments on Treatment Session: Family present during treatment session and continue to want pt to d/c home with HHPT.  Pt continues to be +2 assist but able to participate further and ambulate this session.  If caregiver unable to provide +2 assist recommend SNF to increase mobility and decrease burden of care. PT Plan: Discharge plan remains appropriate;Frequency remains appropriate PT Frequency: Min 5X/week Follow Up Recommendations: 24 hour supervision/assistance;Home health PT Equipment Recommended: None recommended by PT PT Goals  Acute Rehab PT Goals PT Goal Formulation: With patient/family Time For Goal Achievement: 2 weeks Pt will Roll Supine to Right Side: with min assist PT Goal: Rolling Supine to Right Side - Progress: Progressing toward goal Pt will Roll Supine to Left Side: with min assist Pt will go Supine/Side to Sit: with min assist;with HOB 0 degrees PT Goal: Supine/Side to Sit - Progress: Progressing toward goal Pt will go Sit to Stand: with min assist PT Goal: Sit to Stand - Progress: Progressing toward goal Pt will go Stand to Sit: with min assist PT Goal: Stand to Sit - Progress: Progressing toward goal Pt will Transfer Bed to Chair/Chair to Bed: with min assist PT Transfer Goal: Bed to Chair/Chair to Bed - Progress: Progressing toward goal Pt will Ambulate: 16 - 50 feet;with min assist;with rolling walker PT Goal: Ambulate - Progress: Progressing toward goal Pt will Perform Home Exercise Program: with supervision, verbal cues required/provided  PT Treatment Precautions/Restrictions  Precautions Precautions: Fall Required Braces or Orthoses: No Restrictions Weight Bearing Restrictions: No Mobility (including Balance) Bed Mobility Bed Mobility: Yes Rolling Right: 3: Mod assist;With rail Rolling  Right Details (indicate cue type and reason): VCs for hand placement and using rail to assist.  (A) to initiate transfer and complete roll to side lying Right Sidelying to Sit: 3: Mod assist;With rails Right Sidelying to Sit Details (indicate cue type and reason): VCs for technique and (A) to elevate trunk OOB and LE OOB Transfers Transfers: Yes Sit to Stand: 1: +2 Total assist;From chair/3-in-1;With upper extremity assist;From bed;Other (comment) (Pt 50%) Sit to Stand Details (indicate cue type and reason): Max cues for technique and VCs for hand and LE placement.  Pt tends to lean posterior and needs encouragement lean forward duing transfer.  Manual (A) to block LE from extending during transfer. Stand to Sit: 1: +2 Total assist;Patient percentage (comment);To chair/3-in-1;With upper extremity assist;Other (comment) ((Pt 50%)) Stand to Sit Details: (A) to slowly descend to recliner and tactile/VCs for hand placement Stand Pivot Transfers: 1: +2 Total assist;Patient percentage (comment) (Pt 50%) Stand Pivot Transfer Details (indicate cue type and reason): (A) to maintain upright posture. Pt tends to stay in flexed posture throughout transfer.  Manual cues at bilateral hips to promote trunk and hip extension. Ambulation/Gait Ambulation/Gait: Yes Ambulation/Gait Assistance: 1: +2 Total assist;Patient percentage (comment) (Pt 60%) Ambulation/Gait Assistance Details (indicate cue type and reason): +2 (A) to maintain balance and upright posture during ambulation.  Manual (A) at bilateral hips to promote trunk and LE extension Ambulation Distance (Feet): 6 Feet Assistive device: Rolling walker Gait Pattern: Trunk flexed;Shuffle;Step-to pattern;Decreased stride length (narrow stance) Stairs: No Wheelchair Mobility Wheelchair Mobility: No  Posture/Postural Control Posture/Postural Control: Postural limitations Postural Limitations: thoracic kyphosis Balance Balance Assessed: Yes Static Sitting  Balance Static Sitting - Balance Support: Bilateral upper extremity supported;Feet supported Static Sitting - Level of Assistance: 4: Min assist  Static Sitting - Comment/# of Minutes: Pt sat EOB ~5 minutes and needed occasional min (A) to prevent posterior lean.  Pt able to sit with minguard intermittently throughout 5 minutes Exercise    End of Session PT - End of Session Equipment Utilized During Treatment: Gait belt Activity Tolerance: Patient limited by fatigue Patient left: in chair;with call bell in reach;with family/visitor present Nurse Communication: Mobility status for transfers General Behavior During Session: [facility] for tasks performed Cognition: Impaired Cognitive Impairment: either patient is HOH, slow to process or both.  She did better following cues and commands once standing, but to get to the EOB she did not follow any cues.    [name] 11/06/2011, 10:56 AM [phone]

## 2011-11-07 LAB — CBC
Platelets: 193 10*3/uL (ref 150–400)
RBC: 3.88 MIL/uL (ref 3.87–5.11)
WBC: 4.9 10*3/uL (ref 4.0–10.5)

## 2011-11-07 LAB — PROTIME-INR
INR: 1.6 — ABNORMAL HIGH (ref 0.00–1.49)
Prothrombin Time: 19.3 seconds — ABNORMAL HIGH (ref 11.6–15.2)

## 2011-11-07 MED ORDER — WARFARIN SODIUM 7.5 MG PO TABS
7.5000 mg | ORAL_TABLET | Freq: Once | ORAL | Status: AC
  Administered 2011-11-07: 7.5 mg via ORAL
  Filled 2011-11-07: qty 1

## 2011-11-07 NOTE — Progress Notes (Signed)
Subjective: Sitting in bed eating breakfast,. No complaints. PT report reviewed. Grand-daught thinks she is close to what she can manage at home!  Objective: Lab: INR 1.6  Imaging: no new imaging  Physical Exam: Vitals stable, mildly elevated blood pressure Pulm - normal respirations Tele - holding IRIR at controlled rate.    Assessment/Plan: 2. Card- INR slowly coming up. Goal is 2.0 + [place] home 6. MSK/osteoporosis - continuing to make progress.  Goal is home with [facility] PT   [name] 11/07/2011, 9:00 AM

## 2011-11-07 NOTE — Progress Notes (Signed)
Foley catheter removed per protocol.  Instructed family and pt of need to void within 8 hours.  Will continue to monitor for urinary retention.  Zunairah Devers, [facility]

## 2011-11-07 NOTE — Progress Notes (Signed)
ANTICOAGULATION CONSULT NOTE - Follow Up Consult  Pharmacy Consult for Heparin and Coumdin Indication: atrial fibrillation  No Known Allergies  Patient Measurements: Height: 5' (152.4 cm) Weight: 122 lb 1.6 oz (55.384 kg) IBW/kg (Calculated) : 45.5  Adjusted Body Weight:   Vital Signs: Temp: 98 F (36.7 C) (12/15 0500) BP: 147/96 mmHg (12/15 0500) Pulse Rate: 76  (12/15 0500)  Labs:  Basename 11/07/11 0700 11/06/11 0624 11/05/11 1535 11/05/11 0544  HGB 12.1 11.4* -- --  HCT 37.3 34.7* -- 36.0  PLT 193 189 -- 186  APTT -- -- -- --  LABPROT 19.3* 17.1* -- 16.2*  INR 1.60* 1.37 -- 1.27  HEPARINUNFRC 0.47 0.61 0.60 --  CREATININE -- -- -- --  CKTOTAL -- -- -- --  CKMB -- -- -- --  TROPONINI -- -- -- --   Estimated Creatinine Clearance: 36.3 ml/min (by C-G formula based on Cr of 0.82).   Medications:  Heparin 800 units/hr  Assessment: 89yof on Heparin bridging to Coumadin for Afib. Heparin level 0.47 is therapeutic. INR is still subtherapeutic but increasing now after switching to 7.5mg  x 1. - H/H and Plts stable - No bleeding reported  Goal of Therapy:  INR 2-3 Heparin level 0.3-0.7 units/ml   Plan:  1. Coumadin 7.5mg  po x 1 2. Continue heparin 800 units/hr (8 ml/hr) 3. Follow-up AM INR and heparin level  Janace Litten, PharmD [phone] 11/07/2011,8:40 AM

## 2011-11-08 LAB — CBC
Platelets: 200 10*3/uL (ref 150–400)
RDW: 14.7 % (ref 11.5–15.5)
WBC: 5.6 10*3/uL (ref 4.0–10.5)

## 2011-11-08 LAB — HEPARIN LEVEL (UNFRACTIONATED): Heparin Unfractionated: 0.39 IU/mL (ref 0.30–0.70)

## 2011-11-08 LAB — PROTIME-INR
INR: 1.88 — ABNORMAL HIGH (ref 0.00–1.49)
Prothrombin Time: 21.9 seconds — ABNORMAL HIGH (ref 11.6–15.2)

## 2011-11-08 MED ORDER — WARFARIN SODIUM 7.5 MG PO TABS
7.5000 mg | ORAL_TABLET | Freq: Once | ORAL | Status: AC
  Administered 2011-11-08: 7.5 mg via ORAL
  Filled 2011-11-08: qty 1

## 2011-11-08 NOTE — Progress Notes (Signed)
ANTICOAGULATION CONSULT NOTE - Follow Up Consult  Pharmacy Consult for Heparin and Coumdin Indication: atrial fibrillation  No Known Allergies  Patient Measurements: Height: 5' (152.4 cm) Weight: 124 lb 11.2 oz (56.564 kg) IBW/kg (Calculated) : 45.5  Adjusted Body Weight:   Vital Signs: Temp: 98.1 F (36.7 C) (12/16 0500) BP: 136/82 mmHg (12/16 0500) Pulse Rate: 98  (12/16 0500)  Labs:  Basename 11/08/11 0625 11/07/11 0700 11/06/11 0624  HGB 11.5* 12.1 --  HCT 35.0* 37.3 34.7*  PLT 200 193 189  APTT -- -- --  LABPROT 21.9* 19.3* 17.1*  INR 1.88* 1.60* 1.37  HEPARINUNFRC 0.39 0.47 0.61  CREATININE -- -- --  CKTOTAL -- -- --  CKMB -- -- --  TROPONINI -- -- --   Estimated Creatinine Clearance: 36.6 ml/min (by C-G formula based on Cr of 0.82).   Medications:  Heparin 800 units/hr  Assessment: 89yof on Heparin bridging to Coumadin for Afib. Heparin level 0.39 is therapeutic. INR is still subtherapeutic but increasing now after switching to 7.5mg . - H/H and Plts stable - No bleeding reported  Goal of Therapy:  INR 2-3 Heparin level 0.3-0.7 units/ml   Plan:  1. Coumadin 7.5mg  po x 1 tonight at 1800pm 2. Continue heparin 800 units/hr (8 ml/hr) 3. Follow-up AM INR and heparin level 4. F/U INR and d/c heparin when INR at goal   Janace Litten, PharmD [phone] 11/08/2011,7:54 AM

## 2011-11-08 NOTE — Progress Notes (Signed)
Subjective: Patient with no new c/o. Per grand-daughter she is making steady gains with activity and feels she will be able to manage her at home.  Objective: Lab: INR 1.88 Imaging: no new imaging   Physical Exam: Vitals - stable Gen'l- elderly white woman in bed in no distress Pulm - normal respirations Cor - IRIR rate controlled.     Assessment/Plan: 2. Card - stable a. Fib. 6. MSK/osteoporosis - good relief of pain afteer kyphoplasty. Making steady gains re: mobility.  Dispo- anticipate therapeutic INR 12/17 AM and will be able to return home. D/d dictated #[id number]   [name] 11/08/2011, 9:50 AM

## 2011-11-08 NOTE — Discharge Summary (Signed)
NAMEKALENNA, MILLETT NO.:  [id number]  MEDICAL RECORD NO.:  [id number]  LOCATION:  3704                         FACILITY:  [facility]  PHYSICIAN:  Rosalyn Gess. Niranjan Rufener, MD  DATE OF BIRTH:  November 17, 1920  DATE OF ADMISSION:  10/25/2011 DATE OF DISCHARGE:  11/09/2011                              DISCHARGE SUMMARY   ADMITTING DIAGNOSES: 1. Weakness. 2. Acute renal insufficiency. 3. Abnormality of gait. 4. Chronic atrial fibrillation. 5. Hypothermia. 6. Abnormal CT of the head. 7. Fall. 8. Dementia.  DISCHARGE DIAGNOSES: 1. Hypothermia, resolved. 2. Infectious Disease, urinary tract infection, completed a course of     Rocephin with good results. 3. Cardiology, patient with atrial fibrillation, on Coumadin, who had     episodes of rapid ventricular response now controlled. 4. Chronic kidney disease II, the patient with a baseline creatinine     at discharge. 5. History of congestive heart failure, the patient with faint rales     with clearing of all symptoms by discharge. 6. MSK/osteoporosis, patient with T11-L4 compression fractures with L4     being old.  Status post kyphoplasty T11.  HISTORY OF PRESENT ILLNESS:  The patient is an 77 year old woman who presented to the hospital because of increasing weakness, inability to manage her activities of daily living, and complaining of severe back pain.  She was seen in the emergency department and found to have urinary tract infection, was in atrial fibrillation which is chronic and had significant mobility issues.  The patient also with mild exacerbation of CKD.  She was subsequently admitted for treatment of the above problems.  Please see the H and P for past medical history, family history, social history, and admission examination.  HOSPITAL COURSE: 1. Hypothermia.  Patient presented with a low temperature.  She did     require a Lawyer.  She rapidly responded to treatment and this     problem was  resolved. 2. ID, the patient with urinary tract infection.  Urine culture came     back as negative.  The patient was treated with Rocephin and on     this therapy for 7 days she had a significant improvement in her     mental status.  This problem was considered resolved without     complications. 3. Cardiology, the patient has a long-standing history of atrial     fibrillation on chronic Coumadin therapy.  She has been cardiac     stable.  During this hospitalization, she did have a rapid     ventricular response in beta-blockers were increased as a result.     She had cardiac enzymes that were cycled x3 and were negative with     CKs, MB of 35.7, 22.1 and 13.2.  Troponin-Is were cycled and were     negative x3 at less than 0.30.  The patient tolerated her rapid     ventricular response well, but did better with controlled rate.  At     the time of this dictation, she was still in atrial fibrillation     with a controlled rate. 4. CKD.  The patient was admitted with a mildly elevated creatinine at  1.40.  She was gently hydrated through the course of her     hospitalization and final creatinine on November 02, 2011, was 0.82     and stable. 5. CHF.  The patient does have a history of systolic heart failure.     During her hospitalization, she did go accumulate a small amount of     fluid and had rales.  She responded to a brief course of     furosemide.  She demonstrated no further excessive signs of     exacerbation and at time of this dictation was stable in regards to     CHF. 6. MSK/osteoporosis.  The patient did complain of significant back     pain.  This was the main cause for her loss of mobility.  Plain     films did reveal the patient to have L4 compression fracture.  The     patient did come to MRI scan.  This was performed on October 27, 2011, revealed the patient to have an acute or subacute T11     superior endplate compression fracture was between 25-50% loss  of     vertebral height.  She had chronic L4 and L2 compression fractures.     The patient was seen in consultation by the Interventional     Radiology Service and was felt to be a candidate for kyphoplasty.     It was necessary for the patient to be taken off of Coumadin, so     the procedure could be performed safely.  The patient was bridged     with IV heparin.  She eventually came to kyphoplasty after 1 day's     delayed due to rapid ventricular response to her Afib.  She had a     procedure on November 03, 2011, and did well with rapid improvement     in her symptoms.  The patient was seen and evaluated by Physical     Therapy who felt she did require 2+ assistance and was suggesting     either skilled care for short-term rehab or if the granddaughter     could manage to have help at home, that she might be able to do     well there.  Through the course of her hospitalization while     awaiting for her Coumadin to become therapeutic, she did show     steady improvement.  By the time of this dictation, the patient     definitely was suitable for going home with her granddaughter being     able to provide assistance for her to manage her ADLs at home.  We     will arrange for home health physical therapy, and nursing to see     the patient to monitor her coag state and also to help with her     progression and mobility.  With the patient's primary medical problem of urinary tract infection being resolved and her 2nd primary problem of T11 compression fracture being treated with kyphoplasty with reduction in pain and increase in mobility, the patient will be ready for discharge to home once her INR is therapeutic.  At the time of this dictation, INR is 1.88 with anticipation of a therapeutic INR on the morning of December 17th, which Her only barrier to being discharged home.  Addendum will be dictated to do a final discharge physical examination. The patient will resume all of her  home medications, but  we will continue with increase in her beta-blocker at 25 mg of Lopressor b.i.d.  Patient's prognosis is stable, but guarded given her advanced age and multiple comorbidities.  Addendum to be done at morning of December 17th for physical exam.     Rosalyn Gess. Coady Train, MD     MEN/MEDQ  D:  11/08/2011  T:  11/08/2011  Job:  [id number]

## 2011-11-09 LAB — PROTIME-INR: INR: 2.54 — ABNORMAL HIGH (ref 0.00–1.49)

## 2011-11-09 LAB — HEPARIN LEVEL (UNFRACTIONATED): Heparin Unfractionated: 0.4 IU/mL (ref 0.30–0.70)

## 2011-11-09 LAB — CBC
MCHC: 33 g/dL (ref 30.0–36.0)
RDW: 14.8 % (ref 11.5–15.5)

## 2011-11-09 MED ORDER — WARFARIN SODIUM 5 MG PO TABS: ORAL_TABLET | ORAL | Status: DC

## 2011-11-09 MED ORDER — METOPROLOL TARTRATE 25 MG PO TABS: 25.0000 mg | ORAL_TABLET | Freq: Two times a day (BID) | ORAL | Status: DC

## 2011-11-09 NOTE — Progress Notes (Signed)
Subjective: Feels perky and anxious to go home. Taking a diet. Has been more mobile  Objective: Lab: INR pending  Imaging:none   Physical Exam: Vitals stable Gen'l - elderly white woman in no distress HEENT- ok Chest - clear Cor - IRIR rate controlled Abd-soft, BS+ Neuro - intact.     Assessment/Plan: Home today with home health follow up     [name] 11/09/2011, 7:57 AM

## 2011-11-09 NOTE — Discharge Summary (Signed)
[name], [name] NO.:  [id number]  MEDICAL RECORD NO.:  [id number]  LOCATION:  3704                         FACILITY:  [facility]  PHYSICIAN:  Rosalyn Gess. Norins, MD  DATE OF BIRTH:  04/01/1920  DATE OF ADMISSION:  10/25/2011 DATE OF DISCHARGE:                              DISCHARGE SUMMARY   ADDENDUM:  The patient has done well overnight.  INR is pending but anticipated to be at 2.0 or greater, and she is ready for discharge to home.  PHYSICAL EXAMINATION:  VITAL SIGNS:  Temperature was 98.1, blood pressure 134/80, heart rate 86, respirations 18, O2 sats 94% on room air. GENERAL APPEARANCE:  This is an elderly Caucasian female, in no acute distress.  HEENT:  Normocephalic, atraumatic.  Conjunctivae and sclerae were clear.  Pupils equal, round, and reactive. NECK:  Supple. CHEST:  The patient is moving air well with no rales, wheezes, or rhonchi. CARDIOVASCULAR:  2+ radial pulse.  No JVD, no carotid bruits.  She had an irregularly irregular heart rate that was rate controlled. BREASTS:  Deferred. ABDOMEN:  Soft.  No guarding or rebound.  No organosplenomegaly was noted. GENITALIA:  Deferred. RECTAL:  Deferred. EXTREMITIES:  Without clubbing, cyanosis.  No edema was noted. DERM:  Skin with no signs of skin breakdown on arms, legs or buttocks. NEURO:  The patient is awake, alert, oriented to person, place, time, and context.  Cranial nerves II-XII are grossly intact.  The patient had no cerebellar dysfunction with no tremor.  Strength and balance:  The patient was not stood or walked but by family report, she has been doing better, able to sit on the side of the bed, able to stand and make transfers with minimal assistance.  She definitely is capable to be managed at home by her grand-daughter with home health PT to assist.  FINAL LABORATORY:  INR is pending.  CBC was unremarkable with a white count of 5500, hemoglobin 12.0 g, platelet count  205,000.  DISPOSITION:  The patient is to be discharged to home.  Home Health has been requested for Home Health Nursing to help monitor INRs as well as atrial fibrillation.  PT is ordered for evaluation and treatment for increasing strength and mobility.  FOLLOWUP:  The patient will be seen on an as-needed basis.  The patient's granddaughter very familiar with our office procedures and will be working here on an as-needed basis.  CONDITION AT THE TIME OF ADDENDUM DICTATION:  Stable and improved and ready for discharge.     Rosalyn Gess Norins, MD     MEN/MEDQ  D:  11/09/2011  T:  11/09/2011  Job:  [id number]

## 2011-11-09 NOTE — Progress Notes (Signed)
   CARE MANAGEMENT NOTE 11/09/2011  Patient:  [name],[name]   Account Number:  [id number]  Date Initiated:  10/26/2011  Documentation initiated by:  [facility]  Subjective/Objective Assessment:   fall, weakness, UTI, dehydration     Action/Plan:   lives with granddaughter   Anticipated DC Date:  11/09/2011   Anticipated DC Plan:  HOME W HOME HEALTH SERVICES      DC Planning Services  CM consult      [facility] Choice  HOME HEALTH   Choice offered to / List presented to:  C-4 Adult Children        HH arranged  HH-1 RN  HH-2 PT      [facility] agency  [facility]   Status of service:  Completed, signed off Medicare Important Message given?  NO (If response is "NO", the following Medicare IM given date fields will be blank) Date Medicare IM given:   Date Additional Medicare IM given:    Discharge Disposition:  HOME W HOME HEALTH SERVICES  Per UR Regulation:    Comments:  11/09/2011 Met with pt and granddaughter who is the primary care giver, and lives with pt. They selected [facility] for [facility] services , pt has DME, no other needs identified. [name] [name] Kuba RN MPH [phone]  10/26/2011 1545 Utilization review complete. [name] Donning RN CCM Case Mgmt phone [phone]

## 2011-11-10 ENCOUNTER — Encounter: Payer: Self-pay | Admitting: Cardiology

## 2011-11-11 ENCOUNTER — Encounter: Payer: Medicare Other | Admitting: *Deleted

## 2011-11-11 ENCOUNTER — Ambulatory Visit (INDEPENDENT_AMBULATORY_CARE_PROVIDER_SITE_OTHER): Payer: Self-pay | Admitting: Cardiology

## 2011-11-11 ENCOUNTER — Other Ambulatory Visit: Payer: Self-pay | Admitting: Neurology

## 2011-11-11 DIAGNOSIS — R0989 Other specified symptoms and signs involving the circulatory and respiratory systems: Secondary | ICD-10-CM

## 2011-11-11 DIAGNOSIS — I6350 Cerebral infarction due to unspecified occlusion or stenosis of unspecified cerebral artery: Secondary | ICD-10-CM

## 2011-11-11 DIAGNOSIS — I4891 Unspecified atrial fibrillation: Secondary | ICD-10-CM

## 2011-11-11 DIAGNOSIS — Z7901 Long term (current) use of anticoagulants: Secondary | ICD-10-CM

## 2011-11-12 ENCOUNTER — Telehealth: Payer: Self-pay | Admitting: *Deleted

## 2011-11-12 NOTE — Telephone Encounter (Signed)
They are needing verbal for occupational therapy consult to address adls, dressing issues, safety as well as bathroom safety. He can be reached at [phone]

## 2011-11-15 NOTE — Telephone Encounter (Signed)
OK for OT consult

## 2011-11-18 ENCOUNTER — Ambulatory Visit (INDEPENDENT_AMBULATORY_CARE_PROVIDER_SITE_OTHER): Payer: Self-pay | Admitting: Cardiovascular Disease

## 2011-11-18 DIAGNOSIS — I4891 Unspecified atrial fibrillation: Secondary | ICD-10-CM

## 2011-11-18 DIAGNOSIS — Z7901 Long term (current) use of anticoagulants: Secondary | ICD-10-CM

## 2011-11-18 DIAGNOSIS — R0989 Other specified symptoms and signs involving the circulatory and respiratory systems: Secondary | ICD-10-CM

## 2011-11-18 DIAGNOSIS — I6350 Cerebral infarction due to unspecified occlusion or stenosis of unspecified cerebral artery: Secondary | ICD-10-CM

## 2011-11-18 NOTE — Telephone Encounter (Signed)
Pt is needing documentation fax to Adult Day services, since she has been released from the hospital they are needing documentation stating that she is under no restriction and can return to Adult Day Services. This doc can be faxed to [phone].  If we have any question we can call granddaughter Sharen Heck) [phone].

## 2011-11-19 ENCOUNTER — Ambulatory Visit (HOSPITAL_COMMUNITY): Admit: 2011-11-19 | Payer: Medicare Other

## 2011-11-19 NOTE — Telephone Encounter (Signed)
Pts granddaughter would like to speak with you personally

## 2011-11-19 NOTE — Telephone Encounter (Signed)
Spoke with grand-daughter: PT hasn't started. She gave a very good report about increased mobility and function. Based on this information gave a handwritten note giving OK to return to adult care. Will move ahead with PT and when requested OT and DME

## 2011-11-19 NOTE — Telephone Encounter (Signed)
Patient had a compression fracture T11 with kyphoplasty - was not up to full speed at time of hospital discharge - was to have home health PT. Will need either a report from home health PT or an OV before being cleared to return to adult day services.

## 2011-11-23 ENCOUNTER — Telehealth: Payer: Self-pay | Admitting: *Deleted

## 2011-11-23 NOTE — Telephone Encounter (Signed)
Needing a verbal order for bedside comode, transfer bench, and a light weight wheel chair. [name]'s contact [phone] ext [phone]

## 2011-11-25 NOTE — Telephone Encounter (Signed)
Informed Dahlia Client at [facility]

## 2011-11-25 NOTE — Telephone Encounter (Signed)
OK for DME as requested

## 2011-12-02 ENCOUNTER — Ambulatory Visit: Payer: Self-pay | Admitting: Internal Medicine

## 2011-12-02 ENCOUNTER — Telehealth: Payer: Self-pay | Admitting: *Deleted

## 2011-12-02 DIAGNOSIS — I6350 Cerebral infarction due to unspecified occlusion or stenosis of unspecified cerebral artery: Secondary | ICD-10-CM

## 2011-12-02 DIAGNOSIS — Z7901 Long term (current) use of anticoagulants: Secondary | ICD-10-CM

## 2011-12-02 DIAGNOSIS — I4891 Unspecified atrial fibrillation: Secondary | ICD-10-CM

## 2011-12-02 LAB — POCT INR: INR: 4.9

## 2011-12-02 NOTE — Telephone Encounter (Signed)
Patient called & was given instructions by [facility] to skip dosage today & tomorrow and then change dosage from: [2.5 mg M, W, F & 5 mg Tu, Thur, Sat, Sun] to 2.5 Fri, Sun, Mon, Wed & 5 mg on Sat, Tues, Thurs for the next week with a recheck by [facility] in 1 week [Wed 16.13].

## 2011-12-02 NOTE — Telephone Encounter (Signed)
Nurse w/[facility] reports INR/PT results: INR 4.9 / PT 58.7

## 2011-12-02 NOTE — Telephone Encounter (Signed)
Need dosing schedule on warfarin. Hold warfarin today and tomorrow. Will give new dosing orders when I know the current regimen

## 2011-12-03 ENCOUNTER — Telehealth: Payer: Self-pay | Admitting: *Deleted

## 2011-12-03 NOTE — Telephone Encounter (Signed)
[facility] would like to see Pt once a week for 3 more weeks. They need a verbal OK. Please advise.

## 2011-12-03 NOTE — Telephone Encounter (Signed)
k

## 2011-12-04 NOTE — Telephone Encounter (Signed)
Confirmed pt is available for additional home health visits.

## 2011-12-04 NOTE — Telephone Encounter (Signed)
Ok for additional home health visits

## 2011-12-09 ENCOUNTER — Ambulatory Visit (INDEPENDENT_AMBULATORY_CARE_PROVIDER_SITE_OTHER): Payer: Self-pay | Admitting: Internal Medicine

## 2011-12-09 ENCOUNTER — Other Ambulatory Visit: Payer: Self-pay | Admitting: Cardiology

## 2011-12-09 DIAGNOSIS — R0989 Other specified symptoms and signs involving the circulatory and respiratory systems: Secondary | ICD-10-CM

## 2011-12-09 DIAGNOSIS — I6350 Cerebral infarction due to unspecified occlusion or stenosis of unspecified cerebral artery: Secondary | ICD-10-CM

## 2011-12-09 DIAGNOSIS — I4891 Unspecified atrial fibrillation: Secondary | ICD-10-CM

## 2011-12-09 DIAGNOSIS — Z7901 Long term (current) use of anticoagulants: Secondary | ICD-10-CM

## 2011-12-09 LAB — POCT INR: INR: 1.9

## 2011-12-11 ENCOUNTER — Telehealth: Payer: Self-pay | Admitting: *Deleted

## 2011-12-11 NOTE — Telephone Encounter (Signed)
1, MOM 1/2 or 1 capful nightly 2. For abdominal pain - needs ov.

## 2011-12-11 NOTE — Telephone Encounter (Signed)
Grandaughter reports that patient has been having "stomach ache" in the morning that subsides during the day by midday [that began on 01.09.13 and occuring each day since] w/o reason;  Pt has been evaluated by home health nurse who suggested Milk of Magnesia [2 TBS every AM & PM] to regulate bowels; which was done until bowels became loose & "grainy", they have now stopped the milk of magnesia. Caller reports that Pt c/o lower quadrant abdominal pain, dull & constant, no change w/movement. HH nurse advised Pt to call PCP for their opinion.

## 2011-12-11 NOTE — Telephone Encounter (Signed)
OV scheduled 01.22.13 @ 11:30am-Caller informed.

## 2011-12-15 ENCOUNTER — Ambulatory Visit (INDEPENDENT_AMBULATORY_CARE_PROVIDER_SITE_OTHER): Payer: Medicare Other | Admitting: Internal Medicine

## 2011-12-15 DIAGNOSIS — I509 Heart failure, unspecified: Secondary | ICD-10-CM

## 2011-12-15 DIAGNOSIS — I10 Essential (primary) hypertension: Secondary | ICD-10-CM

## 2011-12-15 DIAGNOSIS — R109 Unspecified abdominal pain: Secondary | ICD-10-CM

## 2011-12-15 DIAGNOSIS — I5032 Chronic diastolic (congestive) heart failure: Secondary | ICD-10-CM

## 2011-12-16 NOTE — Assessment & Plan Note (Signed)
Stable with no evidence of decompensation at today's visit although she was not ambulated.  Plan - continue present medical regimen

## 2011-12-16 NOTE — Assessment & Plan Note (Signed)
BP Readings from Last 3 Encounters:  12/15/11 118/68  11/09/11 134/75  10/02/11 100/64   OK control. No change in medications

## 2011-12-16 NOTE — Progress Notes (Signed)
  Subjective:    Patient ID: [name], female    DOB: 1920/01/27, 78 y.o.   MRN: [id number]  HPI Presents for evaluation of abdominal pain. For many days she has been complaining of discomfort in her abdomen: more of a dull aches than sharp pain. She has also had some problems with constipation. Her grand-daughter has assisted with laxative treatment and she has had some good BMs. She has not had N/V, no eating related discomfort, no pain with BM. Her appetite is OK. Today she reports that she doesn't have any discomfort.  Recent hospital records, including all labs, reviewed from admission for compression fracture lumbar spine s/p kyphoplasty. She reports some minor residual pain. She has been working with home health PT and can do transfers, some walking with walker. Due to her stomach problem she was unable to particpate in PT last week and an extension of service has been ordered.  I have reviewed the patient's medical history in detail and updated the computerized patient record.    Review of Systems System review is negative for any constitutional, cardiac, pulmonary, GI or neuro symptoms or complaints other than as described in the HPI.     Objective:   Physical Exam Filed Vitals:   12/15/11 1148  BP: 118/68  Pulse: 97  Temp: 97.8 F (36.6 C)   Gen'l- 78 y/o white woman who appears a bit frail but in no distress Pulm - normal respirations, no wheezing, no increased WOB Cor - RRR Abd - BS+, soft, no guarding, rebound or tenderness to palpation.       Assessment & Plan:  Abdominal pain - asymptomatic at exam with no abnormal findings. She has had full metabolic evaluation recently. No indication for imaging studies.  Plan- no new tests or treatments          Continue present bowel regimen.  Wished her a Happy Birthday.

## 2011-12-17 ENCOUNTER — Telehealth: Payer: Self-pay

## 2011-12-17 NOTE — Telephone Encounter (Signed)
HHRN called to inform MD that pt was schedule for  PT INR this afternoon but pt was not home (at adult day care until 5). After Hours RN has been scheduled to see pt this evening.

## 2011-12-18 ENCOUNTER — Telehealth: Payer: Self-pay | Admitting: *Deleted

## 2011-12-18 NOTE — Telephone Encounter (Signed)
INR was not done by [facility] 1/23 per order, unknow why, patient goes to adult daycare on Thursdays and Fridays,  [facility] nurses having problem locate patient due to this, I have talked with Ashe[facility]  [facility], case manager and she will f/u today with a nurse to check INR. I relayed this information to family member.

## 2011-12-21 ENCOUNTER — Ambulatory Visit (INDEPENDENT_AMBULATORY_CARE_PROVIDER_SITE_OTHER): Payer: Self-pay | Admitting: Internal Medicine

## 2011-12-21 ENCOUNTER — Telehealth: Payer: Self-pay

## 2011-12-21 DIAGNOSIS — R0989 Other specified symptoms and signs involving the circulatory and respiratory systems: Secondary | ICD-10-CM

## 2011-12-21 DIAGNOSIS — R1030 Lower abdominal pain, unspecified: Secondary | ICD-10-CM

## 2011-12-21 DIAGNOSIS — Z7901 Long term (current) use of anticoagulants: Secondary | ICD-10-CM

## 2011-12-21 DIAGNOSIS — I4891 Unspecified atrial fibrillation: Secondary | ICD-10-CM

## 2011-12-21 DIAGNOSIS — I6350 Cerebral infarction due to unspecified occlusion or stenosis of unspecified cerebral artery: Secondary | ICD-10-CM

## 2011-12-21 NOTE — Telephone Encounter (Signed)
Pt's granddaughter called stating pt abd pain is more severe, sharp localized pain near ovaries per caller. Granddaughter is requesting MD reconsider DX, work in OV today?

## 2011-12-21 NOTE — Telephone Encounter (Signed)
No call from [facility] regarding CT scan as of yet; will recheck & call patient back in the morning 01.29.13

## 2011-12-21 NOTE — Telephone Encounter (Signed)
Will go directly for CT abdomen and pelvis. She will need to have a Bmet run stat on way to getting CT at [facility]. Orders in with [facility] and for lab.(please adjust lab order to STAT status)

## 2011-12-22 ENCOUNTER — Other Ambulatory Visit (INDEPENDENT_AMBULATORY_CARE_PROVIDER_SITE_OTHER): Payer: Medicare Other

## 2011-12-22 ENCOUNTER — Ambulatory Visit (INDEPENDENT_AMBULATORY_CARE_PROVIDER_SITE_OTHER)
Admission: RE | Admit: 2011-12-22 | Discharge: 2011-12-22 | Disposition: A | Discharge status: Home / Self Care | Payer: Medicare Other | Source: Ambulatory Visit | Attending: Internal Medicine | Admitting: Internal Medicine

## 2011-12-22 DIAGNOSIS — R1030 Lower abdominal pain, unspecified: Secondary | ICD-10-CM

## 2011-12-22 DIAGNOSIS — R109 Unspecified abdominal pain: Secondary | ICD-10-CM

## 2011-12-22 LAB — COMPREHENSIVE METABOLIC PANEL
AST: 24 U/L (ref 0–37)
Albumin: 3.4 g/dL — ABNORMAL LOW (ref 3.5–5.2)
Alkaline Phosphatase: 69 U/L (ref 39–117)
BUN: 18 mg/dL (ref 6–23)
Potassium: 4.1 mEq/L (ref 3.5–5.1)
Sodium: 141 mEq/L (ref 135–145)

## 2011-12-22 MED ORDER — IOHEXOL 300 MG/ML  SOLN
80.0000 mL | Freq: Once | INTRAMUSCULAR | Status: AC | PRN
  Administered 2011-12-22: 80 mL via INTRAVENOUS

## 2011-12-22 NOTE — Telephone Encounter (Signed)
New CMP order STAT entered. Pt will be at lab 12:00pm before CT Scan scheduled for 2:30pm today 01.29.13/SLS

## 2011-12-24 ENCOUNTER — Other Ambulatory Visit: Payer: Self-pay | Admitting: Internal Medicine

## 2011-12-24 ENCOUNTER — Other Ambulatory Visit: Payer: Self-pay

## 2011-12-24 MED ORDER — OMEPRAZOLE 20 MG PO CPDR: 20.0000 mg | DELAYED_RELEASE_CAPSULE | Freq: Every day | ORAL | Status: DC

## 2011-12-25 DIAGNOSIS — M6281 Muscle weakness (generalized): Secondary | ICD-10-CM

## 2011-12-25 DIAGNOSIS — R269 Unspecified abnormalities of gait and mobility: Secondary | ICD-10-CM

## 2011-12-25 DIAGNOSIS — IMO0002 Reserved for concepts with insufficient information to code with codable children: Secondary | ICD-10-CM

## 2011-12-25 DIAGNOSIS — I509 Heart failure, unspecified: Secondary | ICD-10-CM

## 2011-12-27 ENCOUNTER — Emergency Department (HOSPITAL_COMMUNITY): Payer: Medicare Other

## 2011-12-27 ENCOUNTER — Emergency Department (HOSPITAL_COMMUNITY)
Admission: EM | Admit: 2011-12-27 | Discharge: 2011-12-27 | Disposition: A | Discharge status: Home / Self Care | Payer: Medicare Other | Attending: Emergency Medicine | Admitting: Emergency Medicine

## 2011-12-27 ENCOUNTER — Other Ambulatory Visit: Payer: Self-pay

## 2011-12-27 ENCOUNTER — Encounter (HOSPITAL_COMMUNITY): Payer: Self-pay

## 2011-12-27 DIAGNOSIS — F0390 Unspecified dementia without behavioral disturbance: Secondary | ICD-10-CM | POA: Insufficient documentation

## 2011-12-27 DIAGNOSIS — F329 Major depressive disorder, single episode, unspecified: Secondary | ICD-10-CM | POA: Insufficient documentation

## 2011-12-27 DIAGNOSIS — R5381 Other malaise: Secondary | ICD-10-CM | POA: Insufficient documentation

## 2011-12-27 DIAGNOSIS — Z7901 Long term (current) use of anticoagulants: Secondary | ICD-10-CM | POA: Insufficient documentation

## 2011-12-27 DIAGNOSIS — Z853 Personal history of malignant neoplasm of breast: Secondary | ICD-10-CM | POA: Insufficient documentation

## 2011-12-27 DIAGNOSIS — R059 Cough, unspecified: Secondary | ICD-10-CM | POA: Insufficient documentation

## 2011-12-27 DIAGNOSIS — I10 Essential (primary) hypertension: Secondary | ICD-10-CM | POA: Insufficient documentation

## 2011-12-27 DIAGNOSIS — R05 Cough: Secondary | ICD-10-CM | POA: Insufficient documentation

## 2011-12-27 DIAGNOSIS — N390 Urinary tract infection, site not specified: Secondary | ICD-10-CM | POA: Insufficient documentation

## 2011-12-27 DIAGNOSIS — I499 Cardiac arrhythmia, unspecified: Secondary | ICD-10-CM | POA: Insufficient documentation

## 2011-12-27 DIAGNOSIS — R0789 Other chest pain: Secondary | ICD-10-CM | POA: Insufficient documentation

## 2011-12-27 DIAGNOSIS — F3289 Other specified depressive episodes: Secondary | ICD-10-CM | POA: Insufficient documentation

## 2011-12-27 DIAGNOSIS — Z8673 Personal history of transient ischemic attack (TIA), and cerebral infarction without residual deficits: Secondary | ICD-10-CM | POA: Insufficient documentation

## 2011-12-27 LAB — COMPREHENSIVE METABOLIC PANEL
BUN: 19 mg/dL (ref 6–23)
CO2: 29 mEq/L (ref 19–32)
Calcium: 9.1 mg/dL (ref 8.4–10.5)
Creatinine, Ser: 1.08 mg/dL (ref 0.50–1.10)
GFR calc Af Amer: 51 mL/min — ABNORMAL LOW (ref >90)
GFR calc non Af Amer: 44 mL/min — ABNORMAL LOW (ref >90)
Glucose, Bld: 107 mg/dL — ABNORMAL HIGH (ref 70–99)
Total Bilirubin: 0.5 mg/dL (ref 0.3–1.2)

## 2011-12-27 LAB — DIFFERENTIAL
Basophils Relative: 0 % (ref 0–1)
Eosinophils Relative: 1 % (ref 0–5)
Lymphs Abs: 1.1 10*3/uL (ref 0.7–4.0)
Monocytes Absolute: 0.9 10*3/uL (ref 0.1–1.0)
Neutro Abs: 3.7 10*3/uL (ref 1.7–7.7)

## 2011-12-27 LAB — CBC
HCT: 38.2 % (ref 36.0–46.0)
MCH: 30.8 pg (ref 26.0–34.0)
MCV: 92.7 fL (ref 78.0–100.0)
Platelets: 184 10*3/uL (ref 150–400)
RBC: 4.12 MIL/uL (ref 3.87–5.11)

## 2011-12-27 LAB — URINALYSIS, ROUTINE W REFLEX MICROSCOPIC
Nitrite: NEGATIVE
Protein, ur: NEGATIVE mg/dL
Specific Gravity, Urine: 1.019 (ref 1.005–1.030)
Urobilinogen, UA: 0.2 mg/dL (ref 0.0–1.0)

## 2011-12-27 LAB — URINE MICROSCOPIC-ADD ON

## 2011-12-27 MED ORDER — DEXTROSE 5 % IV SOLN
1.0000 g | Freq: Once | INTRAVENOUS | Status: AC
  Administered 2011-12-27: 1 g via INTRAVENOUS
  Filled 2011-12-27: qty 10

## 2011-12-27 MED ORDER — ALBUTEROL SULFATE (5 MG/ML) 0.5% IN NEBU
INHALATION_SOLUTION | RESPIRATORY_TRACT | Status: AC
  Administered 2011-12-27: 14:00:00
  Filled 2011-12-27: qty 1

## 2011-12-27 MED ORDER — CEPHALEXIN 500 MG PO CAPS: 500.0000 mg | ORAL_CAPSULE | Freq: Two times a day (BID) | ORAL | Status: AC

## 2011-12-27 MED ORDER — ALBUTEROL SULFATE HFA 108 (90 BASE) MCG/ACT IN AERS
2.0000 | INHALATION_SPRAY | Freq: Four times a day (QID) | RESPIRATORY_TRACT | Status: DC
  Administered 2011-12-27: 2 via RESPIRATORY_TRACT
  Filled 2011-12-27: qty 6.7

## 2011-12-27 MED ORDER — SODIUM CHLORIDE 0.9 % IV BOLUS (SEPSIS)
500.0000 mL | Freq: Once | INTRAVENOUS | Status: AC
  Administered 2011-12-27: 500 mL via INTRAVENOUS

## 2011-12-27 NOTE — ED Notes (Signed)
GNF:[id number]<YQ> Expected date:12/27/11<BR> Expected time: 1:06 PM<BR> Means of arrival:Ambulance<BR> Comments:<BR> M35. 78 yo f. SOB, congested, hx of COPD. 20-25 mins

## 2011-12-27 NOTE — ED Notes (Signed)
Pt brought by ems from with c/o cough, congestion and gen weakness going back to before christmas but progressively worsening.

## 2011-12-27 NOTE — ED Provider Notes (Signed)
History     CSN: [id number]  Arrival date & time 12/27/11  1308   First MD Initiated Contact with Patient 12/27/11 1341      Chief Complaint  Patient presents with  . Cough  . Nasal Congestion  . Fatigue    HPI This elderly female with dementia, now presents with several days of cough, congestion, generalized fatigue.  Notably, the patient's daughter notes that her fatigue actually has been there, worsening for greater than one month.  The patient is incapable of providing history of present illness, secondary to dementia.  No clear alleviating or exacerbating factors. Past Medical History  Diagnosis Date  . History of breast cancer     Hx of left, s/p lumpectomy '08 also XRT  . History of CVA (cerebrovascular accident) 2008  . Urinary incontinence     irritable bladder  . PVD (peripheral vascular disease)     most recent ABI 0.79 on the right and 0.53 on the  left  . Hip fracture 2009    left  . Atrial fibrillation   . CHF (congestive heart failure)   . Hypertension   . Osteoporosis   . CVA (cerebral vascular accident) 2008  . Peripheral vascular disease   . Urinary incontinence   . Hyperlipidemia   . Fracture of hip   . Dementia   . Cancer of breast, female     Past Surgical History  Procedure Date  . Breast lumpectomy   . Orif left hip 07/2008  . Cystoscopy     w/botox  . Tonsillectomy   . Breast lumpectomy     left breast  . Orif left hip 2009  . Eye surgery     cataracts    History reviewed. No pertinent family history.  History  Substance Use Topics  . Smoking status: Never Smoker   . Smokeless tobacco: Not on file  . Alcohol Use: No    OB History    Grav Para Term Preterm Abortions TAB SAB Ect Mult Living   1 1              Review of Systems  Unable to perform ROS: Dementia    Allergies  Review of patient's allergies indicates no known allergies.  Home Medications   Current Outpatient Rx  Name Route Sig Dispense Refill  .  ACETAMINOPHEN 325 MG PO TABS Oral Take 325 mg by mouth every 6 (six) hours as needed.    Marland Kitchen DOCUSATE SODIUM 100 MG PO CAPS Oral Take 100 mg by mouth as needed.      . FUROSEMIDE 20 MG PO TABS Oral Take 20 mg by mouth 2 (two) times daily.     Marland Kitchen GLUCOSAMINE PO Oral Take 30 mLs by mouth daily.     Marland Kitchen MEMANTINE HCL 10 MG PO TABS Oral Take 10 mg by mouth 2 (two) times daily.     Marland Kitchen METOPROLOL TARTRATE 25 MG PO TABS  TAKE 1 TABLET TWICE E A DAY AS DIRECTED 60 tablet 5  . CENTRUM PO TABS Oral Take 1 tablet by mouth daily. liquid    . NAMENDA 10 MG PO TABS  TAKE 1 TABLET BY MOUTH TWICE DAILY 60 tablet 3  . WARFARIN SODIUM 5 MG PO TABS Oral Take 5 mg by mouth as directed. Take 1/2 tablet on mon, wed, sat and take whole tablet on remaining days.    . CEPHALEXIN 500 MG PO CAPS Oral Take 1 capsule (500 mg total) by mouth 2 (  two) times daily. 14 capsule 0    BP 133/83  Pulse 128  Temp(Src) 97.9 F (36.6 C) (Oral)  Resp 16  Ht 4\' 5"  (1.346 m)  Wt 105 lb (47.628 kg)  BMI 26.28 kg/m2  SpO2 95%  Physical Exam  Nursing note and vitals reviewed. Constitutional: She is oriented to person, place, and time. She appears well-developed and well-nourished. No distress.       Elderly female resting comfortably  HENT:  Head: Normocephalic and atraumatic.  Eyes: Conjunctivae and EOM are normal.  Cardiovascular: An irregularly irregular rhythm present.       Patient with known afib  Pulmonary/Chest: Effort normal. No stridor. No respiratory distress. She has decreased breath sounds.  Abdominal: She exhibits no distension.  Musculoskeletal: She exhibits no edema.  Neurological: She is alert and oriented to person, place, and time. No cranial nerve deficit.       Does not participate fully in neurologic exam  Skin: Skin is warm and dry.  Psychiatric: She is withdrawn. She exhibits a depressed mood. She is noncommunicative.    ED Course  Procedures (including critical care time)  Labs Reviewed  DIFFERENTIAL  - Abnormal; Notable for the following:    Monocytes Relative 15 (*)    All other components within normal limits  COMPREHENSIVE METABOLIC PANEL - Abnormal; Notable for the following:    Potassium 3.4 (*)    Glucose, Bld 107 (*)    Albumin 3.2 (*)    GFR calc non Af Amer 44 (*)    GFR calc Af Amer 51 (*)    All other components within normal limits  URINALYSIS, ROUTINE W REFLEX MICROSCOPIC - Abnormal; Notable for the following:    APPearance CLOUDY (*)    Leukocytes, UA MODERATE (*)    All other components within normal limits  URINE MICROSCOPIC-ADD ON - Abnormal; Notable for the following:    Crystals CA OXALATE CRYSTALS (*)    All other components within normal limits  CBC  PROTIME-INR   Dg Chest 2 View  12/27/2011  *RADIOLOGY REPORT*  Clinical Data: Chest discomfort  CHEST - 2 VIEW  Comparison: 02/18/2011  Findings: Evidence of lower thoracic vertebral plasty noted. Moderate enlargement cardiomediastinal silhouette is present. Diffusely prominent interstitial markings are noted with suggestion of a few interstitial Kerley B lines.  Trace effusions are present. Bones are osteopenic.  No new compression deformity is identified, although there is thoracic kyphosis. Extensive aortic calcification noted without calcified aneurysm identified.  IMPRESSION: Moderate cardiomegaly with probable trace interstitial edema and pleural effusions.  Original Report Authenticated By: Harrel Lemon, M.D.     1. Urinary tract infection     Radiographic studies interpreted by the  Cardiac monitor 110, atrial fibrillation, abnormal Pulse oximetry 100%, room air, normal   Date: 12/27/2011  Rate: 105  Rhythm: atrial fibrillation  QRS Axis: left  Intervals: QT prolonged  ST/T Wave abnormalities: nonspecific ST/T changes  Conduction Disutrbances:left bundle branch block  Narrative Interpretation:   Old EKG Reviewed: changes noted  Mild changes - abnormal ECG  MDM  This 78 year old female  with dementia presents from home with progressive fatigue, mild congestion.  On exam she is in no distress, not hypoxic, not tachypneic.  The patient's labs notable for suggestion of urinary tract infection, without systemic effects.  Prolonged discussion was conducted with the patient's daughter regarding goals of care, long-term prognosis.  We agreed the patient would be more comfortable at home, and was provided one dose  of IV antibiotics prior to discharge.  Hospice referral was discussed at length as well.  The patient's family will discuss this with the patient's primary care physician tomorrow        Gerhard Munch, MD 12/27/11 1539

## 2011-12-28 ENCOUNTER — Telehealth: Payer: Self-pay

## 2011-12-28 NOTE — Telephone Encounter (Signed)
Caller wanted to inform MD that pt was seen in [facility] ER yesterday and Dx with Bronchitis and possible bladder infection

## 2011-12-28 NOTE — Telephone Encounter (Signed)
Pt's family informed and will call back in a few days if PRN

## 2011-12-28 NOTE — Telephone Encounter (Signed)
Noted. Reviewed ED MD notes, labs and x-rays. Follow-up visit or home visit if she doesn't improve.

## 2012-01-03 ENCOUNTER — Other Ambulatory Visit: Payer: Self-pay | Admitting: Internal Medicine

## 2012-01-04 ENCOUNTER — Ambulatory Visit (INDEPENDENT_AMBULATORY_CARE_PROVIDER_SITE_OTHER): Payer: Self-pay | Admitting: Cardiology

## 2012-01-04 ENCOUNTER — Telehealth: Payer: Self-pay | Admitting: *Deleted

## 2012-01-04 DIAGNOSIS — I6350 Cerebral infarction due to unspecified occlusion or stenosis of unspecified cerebral artery: Secondary | ICD-10-CM

## 2012-01-04 DIAGNOSIS — I4891 Unspecified atrial fibrillation: Secondary | ICD-10-CM

## 2012-01-04 DIAGNOSIS — Z7901 Long term (current) use of anticoagulants: Secondary | ICD-10-CM

## 2012-01-04 DIAGNOSIS — R0989 Other specified symptoms and signs involving the circulatory and respiratory systems: Secondary | ICD-10-CM

## 2012-01-04 NOTE — Telephone Encounter (Signed)
No. Grand-daughter is very attentive and is reliable in regard to any persistent or recurrent symptoms of bronchitis and with medication adherence.

## 2012-01-04 NOTE — Telephone Encounter (Signed)
Done

## 2012-01-04 NOTE — Telephone Encounter (Signed)
LMOM to Inform caller, [name] w/[facility]--Request Denied.

## 2012-01-04 NOTE — Telephone Encounter (Signed)
Nurse [facility] requesting orders [will take verbal] for: visit once a week for four wks to observe recovery of Bronchitis & compliance w/medication [ABX]. Please advise.

## 2012-01-05 ENCOUNTER — Ambulatory Visit: Payer: Self-pay | Admitting: Gastroenterology

## 2012-02-03 ENCOUNTER — Ambulatory Visit (INDEPENDENT_AMBULATORY_CARE_PROVIDER_SITE_OTHER): Payer: Medicare Other | Admitting: *Deleted

## 2012-02-03 DIAGNOSIS — I6350 Cerebral infarction due to unspecified occlusion or stenosis of unspecified cerebral artery: Secondary | ICD-10-CM

## 2012-02-03 DIAGNOSIS — I4891 Unspecified atrial fibrillation: Secondary | ICD-10-CM

## 2012-02-03 DIAGNOSIS — Z7901 Long term (current) use of anticoagulants: Secondary | ICD-10-CM

## 2012-02-10 IMAGING — CT CT ABD-PELV W/ CM
2 of 5 series · 17 of 46 positions shown, 19 images · IV contrast (omnipaque)
Comparison: None

CLINICAL DATA: Low abdominal pain

CT ABDOMEN AND PELVIS WITH CONTRAST
TECHNIQUE: Multidetector CT imaging of the abdomen and pelvis was
performed following the standard protocol during bolus
administration of intravenous contrast.
Contrast: 80mL OMNIPAQUE IOHEXOL 300 MG/ML IV SOLN

[Series 2: abd/ pel 5mm · axial · 0.65mm/px · z∈[-360,+4]mm · 14 of 83 slices shown, 16 images]
[im 5/83  soft-tissue]
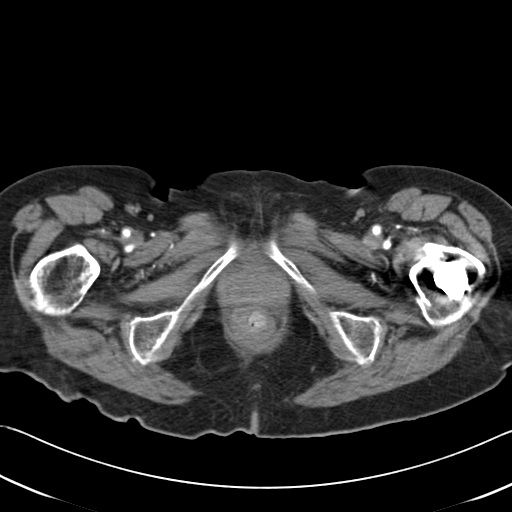
[im 5/83  bone]
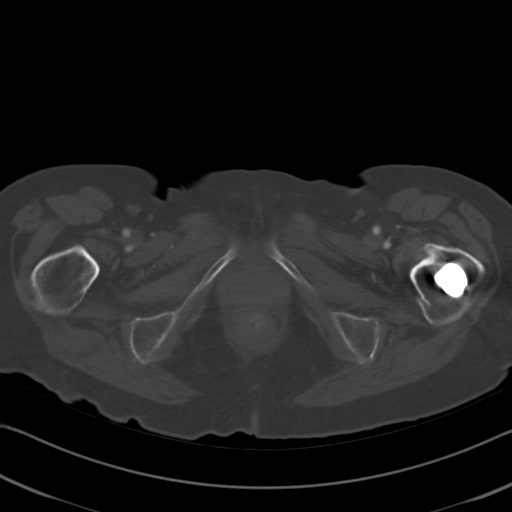
[im 13/83  soft-tissue]
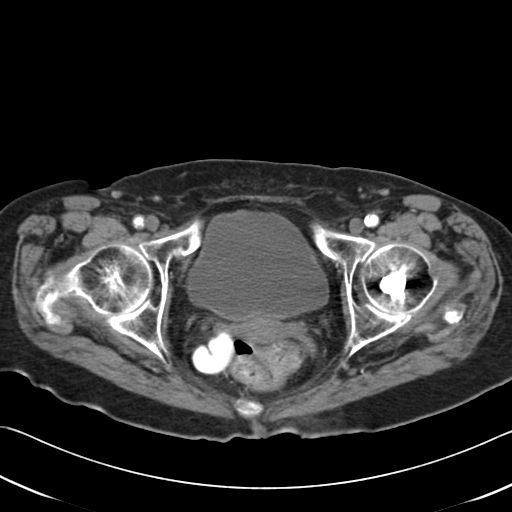
[im 17/83  soft-tissue]
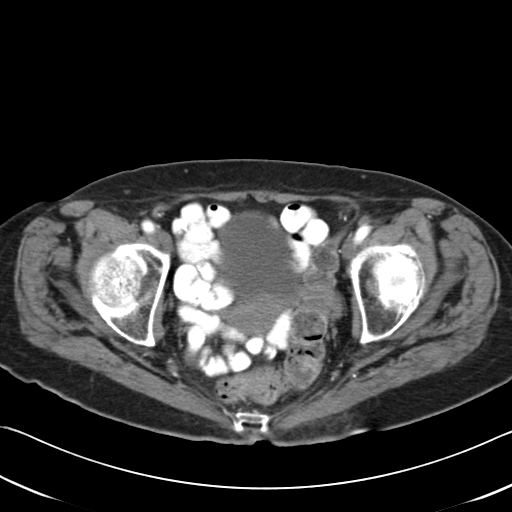
[im 21/83  soft-tissue]
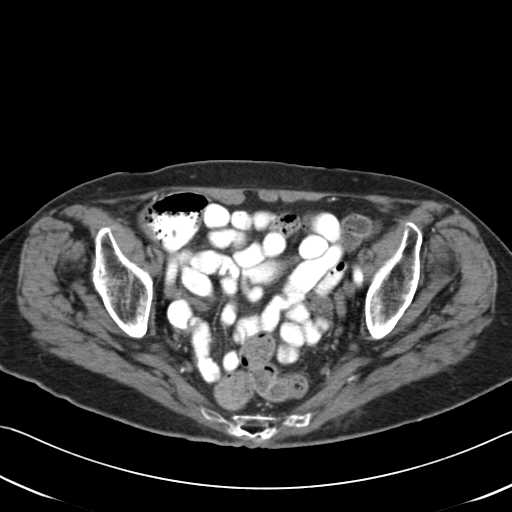
[im 29/83  soft-tissue]
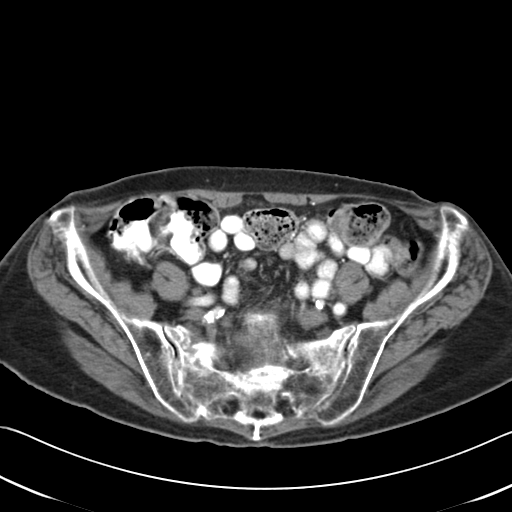
[im 33/83  soft-tissue]
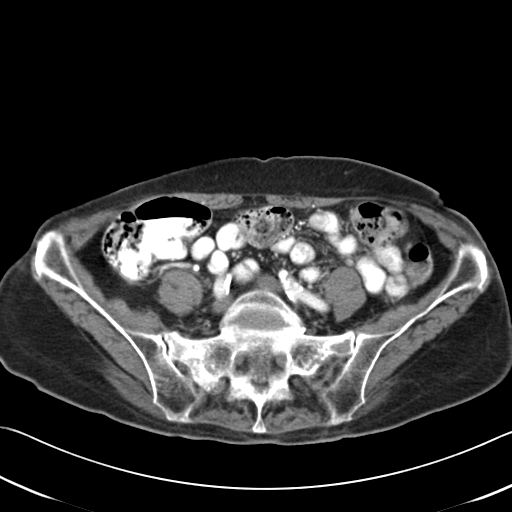
[im 37/83  soft-tissue]
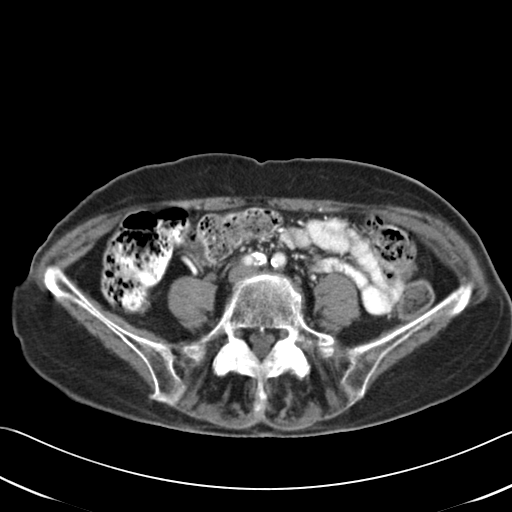
[im 46/83  soft-tissue]
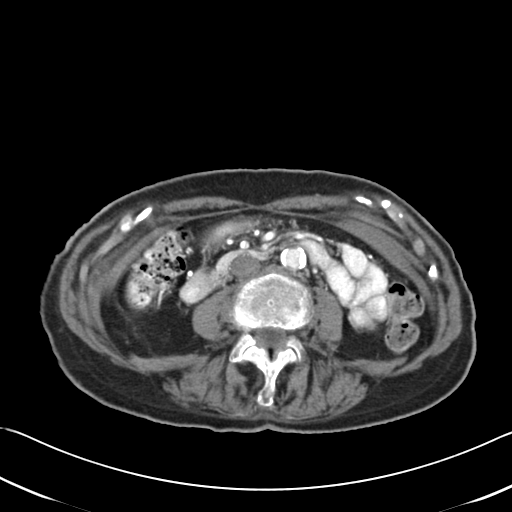
[im 50/83  soft-tissue]
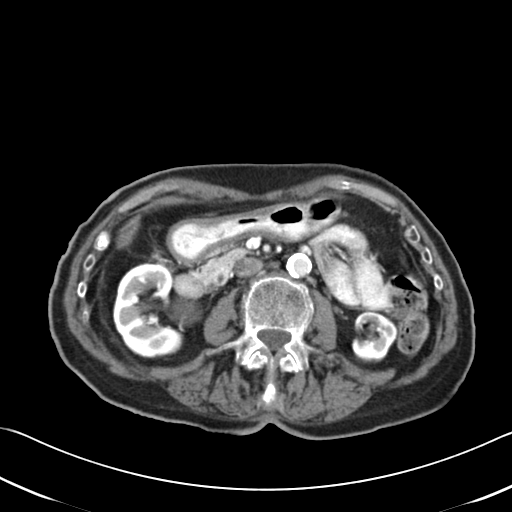
[im 50/83  bone]
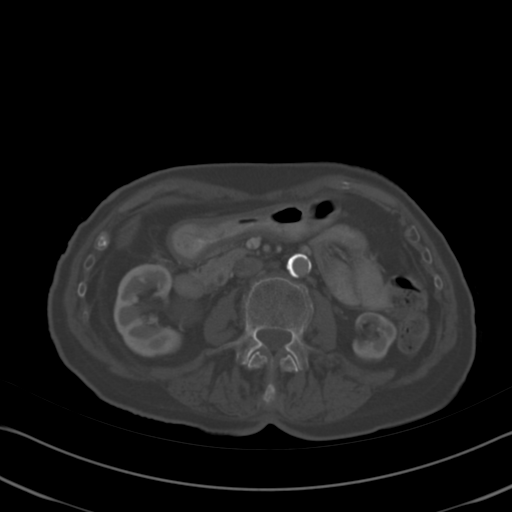
[im 54/83  soft-tissue]
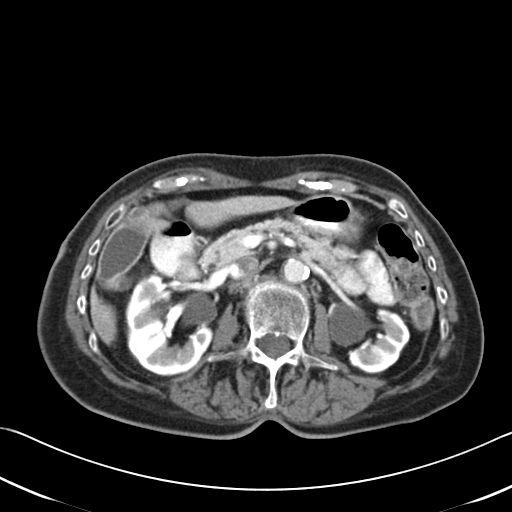
[im 62/83  soft-tissue]
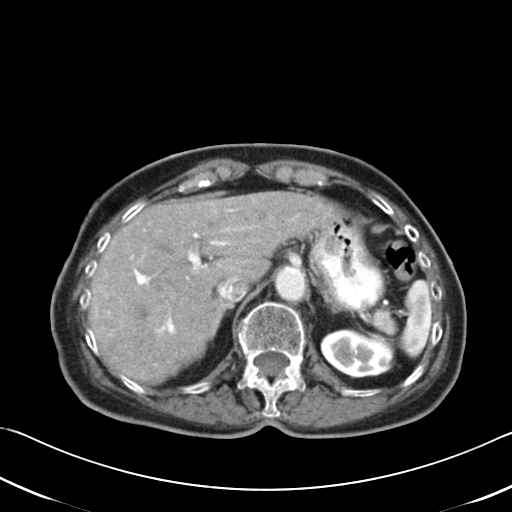
[im 66/83  soft-tissue]
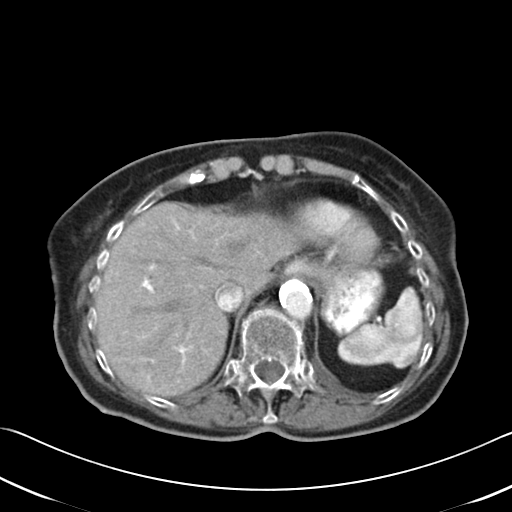
[im 70/83  soft-tissue]
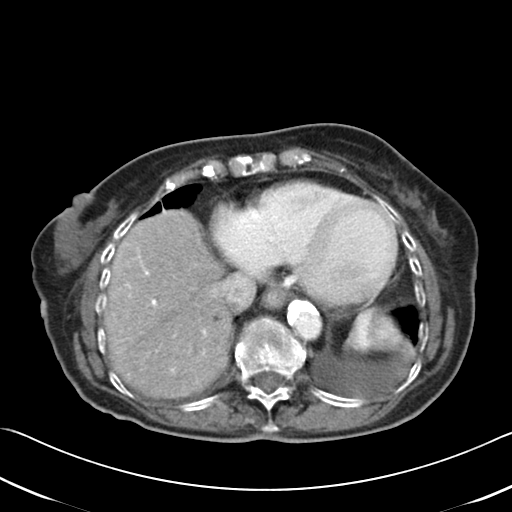
[im 78/83  soft-tissue]
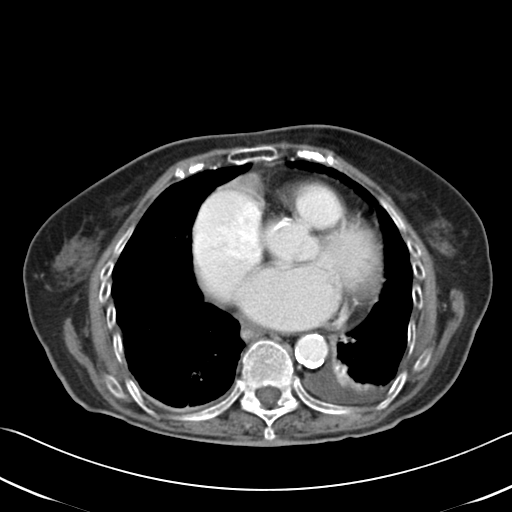

[Series 602: cor · coronal · 0.83mm/px · 3 of 84 slices shown]
[im 28/84  soft-tissue]
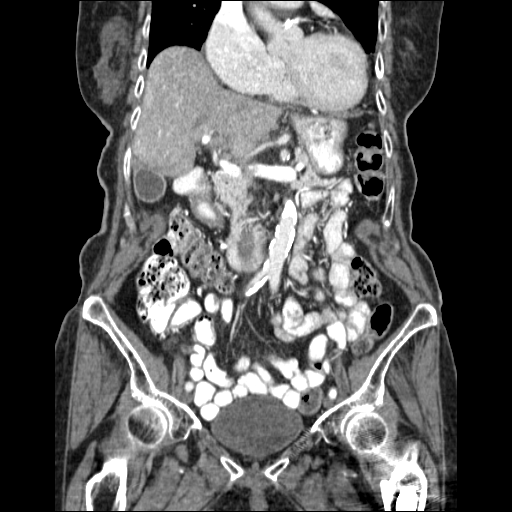
[im 37/84  soft-tissue]
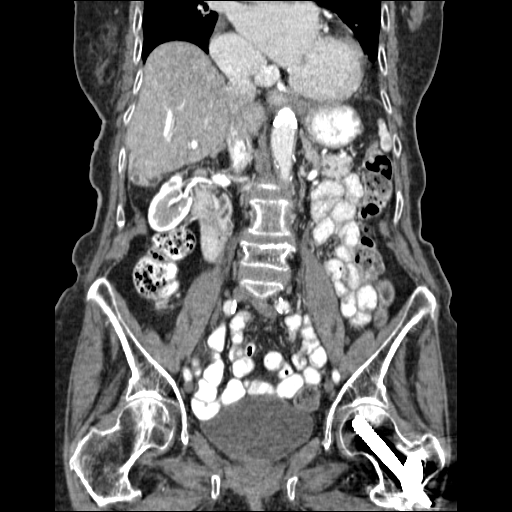
[im 47/84  soft-tissue]
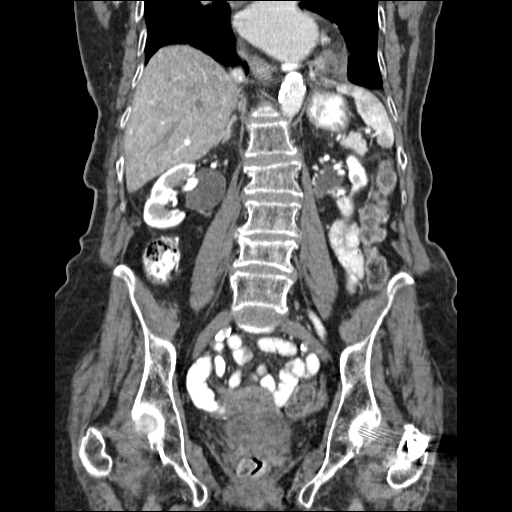

[17 of 46 positions shown; findings below may reference images not displayed]

FINDINGS: Patchy coronary  calcifications.  Four-chamber cardiac
enlargement.  Small bilateral pleural effusions, left greater than
right.  Adjacent atelectasis/consolidation posteriorly in the
visualized lung bases.

Small low attenuation probable cysts in the posterior right hepatic
segment near the dome and in the interpolar region of the right
kidney.  Unremarkable  gallbladder, spleen, adrenal glands, left
kidney, pancreas. Prominent extrarenal pelves with normal contrast
excretion by the kidneys on delayed scans.  Coarse aortoiliac
atheromatous calcifications without aneurysm.  Stomach and small
bowel decompressed.  Appendix not discretely identified.  Colon is
nondilated, unremarkable.  Uterus and adnexal regions unremarkable.
Urinary bladder physiologically distended.  Fixation hardware
across the left femoral neck results in mild streak artifact.  No
ascites.  No free air.  No pelvic,   retroperitoneal, or mesenteric
adenopathy.  Portal vein is patent.  Changes of previous vertebral
augmentation at T11.  There is mild superior   endplate compression
deformity of L2 and moderate superior endplate compression
deformity of L4 without significant retropulsion.
IMPRESSION: 1.  No acute abdominal process.
2.  L2 and L4 compression fracture deformities, age indeterminate.
3.  Small bilateral pleural effusions.
4.  Coronary and aortoiliac arterial calcifications.

## 2012-02-19 ENCOUNTER — Other Ambulatory Visit: Payer: Self-pay | Admitting: Internal Medicine

## 2012-02-22 NOTE — Telephone Encounter (Signed)
Done

## 2012-02-26 ENCOUNTER — Telehealth: Payer: Self-pay | Admitting: *Deleted

## 2012-02-26 NOTE — Telephone Encounter (Signed)
Granddaughter called to inform you that patient had a fall today on sidewalk on her bottom; pt stood up [facility] w/assistance and was assessed by [facility] Nurse: no need for medical attention- no broken skin or pain upon standing and/or walking. Caller would like to know if you advise getting an X-ray due to weak bones, but she does not want to have to go to ED for any imaging. Please advise.

## 2012-02-27 ENCOUNTER — Emergency Department (HOSPITAL_COMMUNITY)
Admission: EM | Admit: 2012-02-27 | Discharge: 2012-02-27 | Disposition: A | Discharge status: Home Health | Payer: Medicare Other | Attending: Emergency Medicine | Admitting: Emergency Medicine

## 2012-02-27 ENCOUNTER — Encounter (HOSPITAL_COMMUNITY): Payer: Self-pay | Admitting: Emergency Medicine

## 2012-02-27 ENCOUNTER — Emergency Department (HOSPITAL_COMMUNITY): Payer: Medicare Other

## 2012-02-27 DIAGNOSIS — S20229A Contusion of unspecified back wall of thorax, initial encounter: Secondary | ICD-10-CM | POA: Insufficient documentation

## 2012-02-27 DIAGNOSIS — I517 Cardiomegaly: Secondary | ICD-10-CM | POA: Insufficient documentation

## 2012-02-27 DIAGNOSIS — M545 Low back pain, unspecified: Secondary | ICD-10-CM | POA: Insufficient documentation

## 2012-02-27 DIAGNOSIS — Z8673 Personal history of transient ischemic attack (TIA), and cerebral infarction without residual deficits: Secondary | ICD-10-CM | POA: Insufficient documentation

## 2012-02-27 DIAGNOSIS — W19XXXA Unspecified fall, initial encounter: Secondary | ICD-10-CM

## 2012-02-27 DIAGNOSIS — Z79899 Other long term (current) drug therapy: Secondary | ICD-10-CM | POA: Insufficient documentation

## 2012-02-27 DIAGNOSIS — Z853 Personal history of malignant neoplasm of breast: Secondary | ICD-10-CM | POA: Insufficient documentation

## 2012-02-27 DIAGNOSIS — F0390 Unspecified dementia without behavioral disturbance: Secondary | ICD-10-CM | POA: Insufficient documentation

## 2012-02-27 DIAGNOSIS — M25559 Pain in unspecified hip: Secondary | ICD-10-CM | POA: Insufficient documentation

## 2012-02-27 LAB — URINALYSIS, ROUTINE W REFLEX MICROSCOPIC
Bilirubin Urine: NEGATIVE
Glucose, UA: NEGATIVE mg/dL
Hgb urine dipstick: NEGATIVE
Nitrite: NEGATIVE
Protein, ur: NEGATIVE mg/dL
Specific Gravity, Urine: 1.023 (ref 1.005–1.030)
Urobilinogen, UA: 0.2 mg/dL (ref 0.0–1.0)
pH: 6 (ref 5.0–8.0)

## 2012-02-27 LAB — URINE MICROSCOPIC-ADD ON

## 2012-02-27 NOTE — ED Provider Notes (Signed)
History     CSN: [id number]  Arrival date & time 02/27/12  1153   First MD Initiated Contact with Patient 02/27/12 1207      Chief Complaint  Patient presents with  . Hip Pain    (Consider location/radiation/quality/duration/timing/severity/associated sxs/prior treatment) HPI Comments: Yesterday, the patient had a mechanical fall from standing height onto her right hip area at the side of a curb. She did strike a little bit to the left side of her chest on the edge of the road as she fell right at the edge. She denies seeing her head or loss of consciousness. The patient was not an excessive pain yesterday but today has gradually had worsening pain in her right flank and hip region. She normally walks with assistance or with a walker. She has not wanted to walk today due to her pain. Initially she denied any pleuritic pain, although when I had her demonstrate a deep breath she indicates a little bit of pain to the left lower rib cage area. No fever or cough. No nausea or vomiting. No mention or weakness to hematuria. The patient does have a history of a lumbar compression fracture after a fall previously, which is concerning to the  patient and family. No bruising or skin rash, abrasion or laceration noted to her back.  The history is provided by the patient and a relative.    Past Medical History  Diagnosis Date  . History of breast cancer     Hx of left, s/p lumpectomy '08 also XRT  . History of CVA (cerebrovascular accident) 2008  . Urinary incontinence     irritable bladder  . PVD (peripheral vascular disease)     most recent ABI 0.79 on the right and 0.53 on the  left  . Hip fracture 2009    left  . Atrial fibrillation   . CHF (congestive heart failure)   . Hypertension   . Osteoporosis   . CVA (cerebral vascular accident) 2008  . Peripheral vascular disease   . Urinary incontinence   . Hyperlipidemia   . Fracture of hip   . Dementia   . Cancer of breast, female      Past Surgical History  Procedure Date  . Breast lumpectomy   . Orif left hip 07/2008  . Cystoscopy     w/botox  . Tonsillectomy   . Breast lumpectomy     left breast  . Orif left hip 2009  . Eye surgery     cataracts    Family History  Problem Relation Age of Onset  . Heart disease Mother 37  . Breast cancer Mother     History  Substance Use Topics  . Smoking status: Never Smoker   . Smokeless tobacco: Not on file  . Alcohol Use: No    OB History    Grav Para Term Preterm Abortions TAB SAB Ect Mult Living   1 1              Review of Systems  Constitutional: Negative for fever and chills.  HENT: Negative for neck pain.   Respiratory: Negative for cough and shortness of breath.   Cardiovascular: Negative for chest pain.  Gastrointestinal: Negative for abdominal pain.  Genitourinary: Positive for flank pain. Negative for dysuria, hematuria and pelvic pain.  Musculoskeletal: Negative for back pain.  Skin: Negative for rash.  Hematological: Does not bruise/bleed easily.  Psychiatric/Behavioral: Negative for confusion.    Allergies  Review of patient's allergies indicates  no known allergies.  Home Medications   Current Outpatient Rx  Name Route Sig Dispense Refill  . ACETAMINOPHEN 500 MG PO TABS Oral Take 500 mg by mouth every 6 (six) hours as needed. For pain.    Marland Kitchen BISACODYL 5 MG PO TBEC Oral Take 5 mg by mouth daily as needed. For constipation.    . FUROSEMIDE 20 MG PO TABS Oral Take 20 mg by mouth every morning.    Marland Kitchen MEMANTINE HCL 10 MG PO TABS Oral Take 10 mg by mouth 2 (two) times daily.     Marland Kitchen METOPROLOL TARTRATE 25 MG PO TABS Oral Take 12.5 mg by mouth 2 (two) times daily.    Marland Kitchen POLYVINYL ALCOHOL 1.4 % OP SOLN Both Eyes Place 1 drop into both eyes daily.    . WARFARIN SODIUM 5 MG PO TABS Oral Take 2.5-5 mg by mouth See admin instructions. She takes half a tabelt on Sunday, Monday, Wednesday, Friday and a whole tablet on Tuesday, Thursday, Saturday.     . FUROSEMIDE 20 MG PO TABS Oral Take 20 mg by mouth 2 (two) times daily.       BP 100/60  Pulse 74  Temp(Src) 97.8 F (36.6 C) (Oral)  Resp 16  SpO2 95%  Physical Exam  Nursing note and vitals reviewed. Constitutional: She appears well-developed and well-nourished.  HENT:  Head: Normocephalic and atraumatic.  Eyes: Pupils are equal, round, and reactive to light.  Neck: Normal range of motion. Neck supple.  Cardiovascular: Normal rate.   Pulmonary/Chest: Effort normal. No respiratory distress. She has no wheezes. She has no rales. She exhibits no tenderness.  Abdominal: Soft. Normal appearance and bowel sounds are normal. She exhibits no distension. There is no tenderness. There is no rebound and no guarding.    Musculoskeletal: She exhibits no edema and no tenderness.       Thoracic back: She exhibits no bony tenderness, no swelling and no deformity.       Lumbar back: She exhibits no bony tenderness, no swelling and no deformity.  Neurological: She is alert.  Skin: Skin is warm. No rash noted. No pallor.  Psychiatric: She has a normal mood and affect.    ED Course  Procedures (including critical care time)  Labs Reviewed  URINALYSIS, ROUTINE W REFLEX MICROSCOPIC - Abnormal; Notable for the following:    APPearance CLOUDY (*)    Ketones, ur TRACE (*)    Leukocytes, UA TRACE (*)    All other components within normal limits  URINE MICROSCOPIC-ADD ON - Abnormal; Notable for the following:    Bacteria, UA MANY (*)    All other components within normal limits  URINE CULTURE   Dg Chest 1 View  02/27/2012  *RADIOLOGY REPORT*  Clinical Data: Hip pain post fall  CHEST - 1 VIEW  Comparison: 12/27/2011  Findings: Cardiomegaly again noted.  Stable atherosclerotic calcifications of thoracic aorta.  No acute infiltrate or pulmonary edema.  Stable small left pleural effusion with left basilar atelectasis.  Prior vertebral plasty T11 level is stable.  IMPRESSION:  No acute infiltrate or  pulmonary edema.  Stable small left pleural effusion with left basilar atelectasis.  Original Report Authenticated By: Natasha Mead, M.D.   Dg Lumbar Spine Complete  02/27/2012  *RADIOLOGY REPORT*  Clinical Data: Fall, pain, altered mental status  LUMBAR SPINE - COMPLETE 4+ VIEW  Comparison: 12/22/2011  Findings: Five views of the lumbar spine submitted.  Diffuse osteopenia.  No acute fracture or subluxation.  Extensive  atherosclerotic calcifications of the abdominal aorta and the iliac arteries.  Prior vertebral plasty at T11 level again noted.  Stable mild compression deformity superior endplate of the L2 vertebral body.  Stable moderate compression deformity L4 vertebral body. Mild lumbar levoscoliosis.  Mild facet degenerative changes lower lumbar spine.  IMPRESSION:  1.  Diffuse osteopenia.  No acute fracture or subluxation.  Stable prior vertebroplasty at T11 level.  Stable mild compression fracture superior endplate of the L2 vertebral body.  Stable moderate compression fracture superior endplate of the L4 vertebral body.  Original Report Authenticated By: Natasha Mead, M.D.   Dg Hip Complete Right  02/27/2012  *RADIOLOGY REPORT*  Clinical Data: Hip pain post fall  RIGHT HIP - COMPLETE 2+ VIEW  Comparison: None.  Findings: Three views of the right hip submitted.  There is diffuse osteopenia.  No acute fracture or subluxation.  Metallic fixation rod and pin noted in proximal left femur.  IMPRESSION: No acute fracture or subluxation.  Diffuse osteopenia.  Original Report Authenticated By: Natasha Mead, M.D.     1. Fall   2. Contusion of back     2:22 PM Pt resting comfortably, I reviewed plain films myself . No obv fractures noted.  If pain becomes more focal, or gets markedly worse, family member instructed to bring back for CT.  Pt can otherwise follow up with Dr. Debby Bud next week if not improving.  UA showed some WBC's, but not bacteria, culture is added to labs for follow up as well.    MDM   1:02  PM No obv deformity or area of sig tenderness.  No open skin lesions or wounds.  Will get plain films of hip, pelvis and l spine just for some clinical suspicion given age and not wanting to walk.  If neg, I think pt can go home, rest, take some tylenol for pain and follow up with Dr. Debby Bud for recheck if symptoms are not improving in the next few days.  Will check UA for any gross hematuria since on coumadin.  No head injury, LOC or vomiting.          Gavin Pound. Camden Knotek, MD 02/27/12 1424

## 2012-02-27 NOTE — ED Notes (Signed)
Fell from standing position yesterday, did not strike head. Was checked by nurse at daycare after falling. Today is having trouble getting around and c/o right hip pain and right sided pain.

## 2012-02-27 NOTE — ED Notes (Signed)
Discharge instructions reviewed w/ caregiver, verbalizes understanding. No prescriptions provided at discharge.

## 2012-02-27 NOTE — ED Notes (Signed)
ZOX:[id number]<AV> Expected date:02/27/12<BR> Expected time:<BR> Means of arrival:<BR> Comments:<BR> EMS 90 GC - flank pain

## 2012-02-27 NOTE — Discharge Instructions (Signed)
Contusion A contusion is a deep bruise. Contusions are the result of an injury that caused bleeding under the skin. The contusion may turn blue, purple, or yellow. Minor injuries will give you a painless contusion, but more severe contusions may stay painful and swollen for a few weeks.  CAUSES  A contusion is usually caused by a blow, trauma, or direct force to an area of the body. SYMPTOMS   Swelling and redness of the injured area.   Bruising of the injured area.   Tenderness and soreness of the injured area.   Pain.  DIAGNOSIS  The diagnosis can be made by taking a history and physical exam. An X-ray, CT scan, or MRI may be needed to determine if there were any associated injuries, such as fractures. TREATMENT  Specific treatment will depend on what area of the body was injured. In general, the best treatment for a contusion is resting, icing, elevating, and applying cold compresses to the injured area. Over-the-counter medicines may also be recommended for pain control. Ask your caregiver what the best treatment is for your contusion. HOME CARE INSTRUCTIONS   Put ice on the injured area.   Put ice in a plastic bag.   Place a towel between your skin and the bag.   Leave the ice on for 15 to 20 minutes, 3 to 4 times a day.   Only take over-the-counter or prescription medicines for pain, discomfort, or fever as directed by your caregiver. Your caregiver may recommend avoiding anti-inflammatory medicines (aspirin, ibuprofen, and naproxen) for 48 hours because these medicines may increase bruising.   Rest the injured area.   If possible, elevate the injured area to reduce swelling.  SEEK IMMEDIATE MEDICAL CARE IF:   You have increased bruising or swelling.   You have pain that is getting worse.   Your swelling or pain is not relieved with medicines.  MAKE SURE YOU:   Understand these instructions.   Will watch your condition.   Will get help right away if you are not  doing well or get worse.  Document Released: 08/19/2005 Document Revised: 10/29/2011 Document Reviewed: 09/14/2011 [facility] Patient Information 2012 [place], [place].       Ice packs or heating pads may provide additional pain relief when needed.  Tylenol for pain will be adequate.  Follow up with Dr. Debby Bud next week for a re-examination.

## 2012-02-28 LAB — URINE CULTURE
Colony Count: >=100000
Culture  Setup Time: 201304062028

## 2012-02-28 NOTE — Telephone Encounter (Signed)
Dropped into my mailbox a bit late. If she is doing ok  No need for x-rays.

## 2012-02-29 ENCOUNTER — Telehealth: Payer: Self-pay

## 2012-02-29 NOTE — ED Notes (Signed)
+   Urine Chart sent to EDP office for review. 

## 2012-02-29 NOTE — ED Notes (Signed)
Char returned from EDP office. Prescribed Keflex 500 mg PO TID x 7 days. Prescribed by Trixie Dredge PA-C.

## 2012-02-29 NOTE — Telephone Encounter (Signed)
Spoke w/granddaughter and scheduled f/u OV for patient 04.09.13, as she reports that patient is still quite sore/SLS

## 2012-02-29 NOTE — Telephone Encounter (Signed)
[facility] Triage Call Report Triage Record Num: [id number] Operator: Patriciaann Clan Patient Name: [name] Call Date & Time: 02/27/2012 10:23:56AM Patient Phone: [phone] PCP: [name] Patient Gender: Female PCP Fax : [phone] Patient DOB: October 22, 1920 Practice Name: [facility] Reason for Call: Caller: Jasmine/Grandaughter; PCP: [name]; CB#: [phone]; Call regarding Injury/Trauma; Grandaughter states patient fell onto sidewalk when getting out of car 02/26/12 a.m. States abrasion/skin tear noted on forearm, states approx. 3 inchesX 1/2 inch. States patient fell backwards onto buttock and back. Denies head injury. No loss of consciousness. States no bleeding from arm wound. States patient was ambulatory with assistance after injury. States no headache or neck pain. States patient c/o increased pain in right lower back and hip. States patient is now unable to ambulate 02/27/12 a.m. due to pain. States patient is able to pivot and bear partial weight. No bruising or deformity. No swelling noted. Denies headache or neck pain. Urinating normally for patient. Denies hematuria. Patient is on Coumadin. Triage per Hip Injury Protocol. Care advice given per guidelines. Grandaughter advised to have patient evaluated in ED. Advised to call EMS for transport due to patient's immobility and risk of further injury. Grandaughter verbalzies understanding and agreeable. States will have patient transported to [facility] ED for evaluation. Protocol(s) Used: Hip Injury Recommended Outcome per Protocol: See ED Immediately Reason for Outcome: Extremity swelling or limitation of range of motion AND known bleeding disorder OR taking blood thinner, chemotherapy or transplant medications Care Advice: ~ Protect the patient from falling or other harm. ~ Another adult should drive. ~ Apply cloth-covered ice pack or a cool compress to the area while in transit to reduce pain and  swelling. ~ IMMEDIATE ACTION Write down provider's name. List or place the following in a bag for transport with the patient: current prescription and/or nonprescription medications; alternative treatments, therapies and medications; and street drugs. ~ 02/27/2012 10:46:43AM Page 1 of 1 [email or web address]

## 2012-03-01 ENCOUNTER — Encounter: Payer: Self-pay | Admitting: Internal Medicine

## 2012-03-01 ENCOUNTER — Ambulatory Visit (INDEPENDENT_AMBULATORY_CARE_PROVIDER_SITE_OTHER): Payer: Medicare Other | Admitting: Internal Medicine

## 2012-03-01 ENCOUNTER — Other Ambulatory Visit (INDEPENDENT_AMBULATORY_CARE_PROVIDER_SITE_OTHER): Payer: Medicare Other

## 2012-03-01 ENCOUNTER — Ambulatory Visit: Payer: Self-pay | Admitting: Internal Medicine

## 2012-03-01 VITALS — BP 108/72 | HR 67 | Temp 98.1°F | Resp 14 | Wt 106.0 lb

## 2012-03-01 DIAGNOSIS — W19XXXA Unspecified fall, initial encounter: Secondary | ICD-10-CM

## 2012-03-01 DIAGNOSIS — I4891 Unspecified atrial fibrillation: Secondary | ICD-10-CM

## 2012-03-01 DIAGNOSIS — Z7901 Long term (current) use of anticoagulants: Secondary | ICD-10-CM

## 2012-03-01 LAB — PROTIME-INR
INR: 2.2 ratio — ABNORMAL HIGH (ref 0.8–1.0)
Prothrombin Time: 24.4 s — ABNORMAL HIGH (ref 10.2–12.4)

## 2012-03-01 NOTE — Progress Notes (Signed)
  Subjective:    Patient ID: [name], female    DOB: Feb 24, 1920, 78 y.o.   MRN: [id number]  HPI Ms. Angulo had a fall last week. She has unexpected collapse despite the use of a walker. She did land hard outside her adult day care. She did go to [facility] Saturday - eval reviewed: normal exam, reviewed xray reports and images with patient and her family - no fracture hip or spine. She is still sore and is not back to full activity.  PMH, FamHx and SocHx reviewed for any changes and relevance.    Review of Systems System review is negative for any constitutional, cardiac, pulmonary, GI or neuro symptoms or complaints other than as described in the HPI.     Objective:   Physical Exam Filed Vitals:   03/01/12 1327  BP: 108/72  Pulse: 67  Temp: 98.1 F (36.7 C)  Resp: 14   Gen'l- wizzened, elderly white woman in no acute distress Cor - RRR Neuro - non-focal       Assessment & Plan:  Fall - no major injury  Plan - use rolling walker           Resume activity as tolerated           Take APAP 1,000 mg tid for 10 days.

## 2012-03-01 NOTE — Patient Instructions (Signed)
AFter your fall there were no fractures - Yeah!!  Plan - 1. Take tylenol 1000mg  every 8 hours on a regular schedule for 10-14 days            2. Remain active to the degree you are capable of, but active every day so that you do not loose ground in terms of strength

## 2012-03-01 NOTE — ED Notes (Signed)
Prescription for keflex 500mg  po tid x7 days no refills per [facility], pa-c called [facility] at [id number].

## 2012-03-02 ENCOUNTER — Telehealth: Payer: Self-pay | Admitting: *Deleted

## 2012-03-02 ENCOUNTER — Telehealth: Payer: Self-pay | Admitting: Pharmacist

## 2012-03-02 NOTE — Telephone Encounter (Signed)
[facility] (Dr. Arthur Holms' office) called to inform us of INR result for [name] (INR 2.2) from 03/02/11 after she sustained a fall.  Patient's caretaker was instructed to have her continue on current dosing regimen and recheck INR in 4 weeks.  She reports resolving bruises and no continued bleeding issues or medication changes, but knows to call if there are any bleeding complications.

## 2012-03-02 NOTE — Telephone Encounter (Signed)
Message copied by Regis Bill on Wed Mar 02, 2012  1:43 PM ------      Message from: Jacques Navy      Created: Tue Mar 01, 2012 11:44 PM       Be sure [facility] has this report and they should notify patient about dosing and next lab

## 2012-03-02 NOTE — Telephone Encounter (Signed)
Pt is seen at [facility] for PT/INR Extension #556 [per Cardiology]; LMOM with this information and requested call back to let me know that message was received/SLS

## 2012-03-03 NOTE — Telephone Encounter (Signed)
Eddie w/[facility] called back to say that he received my message concerning patient's INR/PT results/SLS

## 2012-03-07 ENCOUNTER — Encounter: Payer: Self-pay | Admitting: Internal Medicine

## 2012-03-07 ENCOUNTER — Ambulatory Visit (INDEPENDENT_AMBULATORY_CARE_PROVIDER_SITE_OTHER)
Admission: RE | Admit: 2012-03-07 | Discharge: 2012-03-07 | Disposition: A | Discharge status: Home / Self Care | Payer: Medicare Other | Source: Ambulatory Visit | Attending: Internal Medicine | Admitting: Internal Medicine

## 2012-03-07 ENCOUNTER — Ambulatory Visit (INDEPENDENT_AMBULATORY_CARE_PROVIDER_SITE_OTHER): Payer: Medicare Other | Admitting: Internal Medicine

## 2012-03-07 ENCOUNTER — Other Ambulatory Visit (INDEPENDENT_AMBULATORY_CARE_PROVIDER_SITE_OTHER): Payer: Medicare Other

## 2012-03-07 ENCOUNTER — Other Ambulatory Visit: Payer: Self-pay | Admitting: Internal Medicine

## 2012-03-07 VITALS — BP 122/82 | HR 82 | Temp 97.4°F

## 2012-03-07 DIAGNOSIS — M25559 Pain in unspecified hip: Secondary | ICD-10-CM

## 2012-03-07 DIAGNOSIS — M25551 Pain in right hip: Secondary | ICD-10-CM

## 2012-03-07 DIAGNOSIS — M79609 Pain in unspecified limb: Secondary | ICD-10-CM

## 2012-03-07 DIAGNOSIS — T148XXA Other injury of unspecified body region, initial encounter: Secondary | ICD-10-CM

## 2012-03-07 DIAGNOSIS — M899 Disorder of bone, unspecified: Secondary | ICD-10-CM

## 2012-03-07 DIAGNOSIS — M79604 Pain in right leg: Secondary | ICD-10-CM

## 2012-03-07 DIAGNOSIS — M8580 Other specified disorders of bone density and structure, unspecified site: Secondary | ICD-10-CM

## 2012-03-07 DIAGNOSIS — M810 Age-related osteoporosis without current pathological fracture: Secondary | ICD-10-CM

## 2012-03-07 LAB — CBC WITH DIFFERENTIAL/PLATELET
Basophils Relative: 0.4 % (ref 0.0–3.0)
Eosinophils Relative: 1.4 % (ref 0.0–5.0)
Lymphocytes Relative: 17.7 % (ref 12.0–46.0)
MCV: 93.3 fl (ref 78.0–100.0)
Monocytes Relative: 8.2 % (ref 3.0–12.0)
Neutrophils Relative %: 72.3 % (ref 43.0–77.0)
Platelets: 194 10*3/uL (ref 150.0–400.0)
RBC: 4.21 Mil/uL (ref 3.87–5.11)
WBC: 6.1 10*3/uL (ref 4.5–10.5)

## 2012-03-07 MED ORDER — TRAMADOL HCL 50 MG PO TABS: 50.0000 mg | ORAL_TABLET | Freq: Three times a day (TID) | ORAL | Status: AC | PRN

## 2012-03-07 NOTE — Progress Notes (Signed)
Subjective:    Patient ID: [name], female    DOB: 02/01/1920, 78 y.o.   MRN: [id number]  HPI Complains of continued right leg pain Onset 2 weeks ago Precipitated by accidental fall April 6, reviewed ER visit for same Denies bruising or swelling Pain exacerbated by upright sitting position and activity Weight bearing achievable with assistance from family Denies groin pain, knee pain or back pain Current pain is unrelieved with Tylenol Family concerned for fracture given history of vertebral fracture and patient's usual high tolerance and for pain  Past Medical History  Diagnosis Date  . History of breast cancer     Hx of left, s/p lumpectomy '08 also XRT  . History of CVA (cerebrovascular accident) 2008  . Urinary incontinence     irritable bladder  . PVD (peripheral vascular disease)     most recent ABI 0.79 on the right and 0.53 on the  left  . Hip fracture 2009    left  . Atrial fibrillation   . CHF (congestive heart failure)   . Hypertension   . Osteoporosis   . CVA (cerebral vascular accident) 2008  . Peripheral vascular disease   . Urinary incontinence   . Hyperlipidemia   . Dementia   . Cancer of breast, female      Review of Systems  Constitutional: Positive for unexpected weight change. Negative for fever and fatigue.  Respiratory: Negative for cough and shortness of breath.   Genitourinary: Negative for dysuria and urgency.  Musculoskeletal: Negative for joint swelling.       Objective:   Physical Exam  BP 122/82  Pulse 82  Temp(Src) 97.4 F (36.3 C) (Oral)  SpO2 98% Wt Readings from Last 3 Encounters:  03/01/12 106 lb (48.081 kg)  12/27/11 105 lb (47.628 kg)  12/15/11 108 lb (48.988 kg)   Constitutional: She appears well-developed and well-nourished. No distress While sitting in wheelchair, able to stand and bear weight with ast but gait not tested. Granddaughter at side  Cardiovascular: Normal rate, regular rhythm and normal heart  sounds.  No murmur heard. No BLE edema. Pulmonary/Chest: Effort normal and breath sounds normal. No respiratory distress. She has no wheezes. Musculoskeletal: Slight enlargement of right thigh as compared to left circumference - tender to palpation posterior side of right femur below the gluteal fold - pelvis stability intact. Normal range of motion t right hip - nontender over groin- No gross deformities Skin: Skin is warm and dry. No rash noted. No erythema.  Psychiatric: She has a normal mood and affect. Her behavior is normal. Judgment and thought content normal.    Lab Results  Component Value Date   WBC 5.8 12/27/2011   HGB 12.7 12/27/2011   HCT 38.2 12/27/2011   PLT 184 12/27/2011   GLUCOSE 107* 12/27/2011   CHOL 231* 12/12/2008   TRIG 71 12/12/2008   HDL 64.1 12/12/2008   LDLDIRECT 151.4 12/12/2008   LDLCALC 83 02/20/2008   ALT 12 12/27/2011   AST 24 12/27/2011   NA 137 12/27/2011   K 3.4* 12/27/2011   CL 98 12/27/2011   CREATININE 1.08 12/27/2011   BUN 19 12/27/2011   CO2 29 12/27/2011   TSH 2.22 08/16/2009   INR 2.2* 03/01/2012   HGBA1C 6.0* 10/26/2011       Assessment & Plan:   Persisting R hip pain (posterior thigh) s/p fall 02/27/12 - slight thigh swelling but no visible bruise ?hematoma given chronic anticoag Reviewed xray from ER - no visible  fx but will recheck xray now - consider MRI given tendency towards fx (hx vertebral fx)\also tramadol to augment tylenol prn pain symptoms

## 2012-03-07 NOTE — Patient Instructions (Signed)
It was good to see you today. Test(s) ordered today. Your results will be called to you after review (48-72hours after test completion). If any changes need to be made, you will be notified at that time. Use tramadol in addition to tylenol as needed for ain

## 2012-03-16 ENCOUNTER — Ambulatory Visit (HOSPITAL_COMMUNITY)
Admission: RE | Admit: 2012-03-16 | Discharge: 2012-03-16 | Disposition: A | Discharge status: Home / Self Care | Payer: Medicare Other | Source: Ambulatory Visit | Attending: Internal Medicine | Admitting: Internal Medicine

## 2012-03-16 DIAGNOSIS — M25551 Pain in right hip: Secondary | ICD-10-CM

## 2012-03-16 DIAGNOSIS — M25559 Pain in unspecified hip: Secondary | ICD-10-CM | POA: Insufficient documentation

## 2012-03-16 DIAGNOSIS — M810 Age-related osteoporosis without current pathological fracture: Secondary | ICD-10-CM

## 2012-03-16 DIAGNOSIS — W19XXXA Unspecified fall, initial encounter: Secondary | ICD-10-CM | POA: Insufficient documentation

## 2012-03-30 ENCOUNTER — Ambulatory Visit (INDEPENDENT_AMBULATORY_CARE_PROVIDER_SITE_OTHER): Payer: Medicare Other | Admitting: Pharmacist

## 2012-03-30 DIAGNOSIS — I4891 Unspecified atrial fibrillation: Secondary | ICD-10-CM

## 2012-03-30 DIAGNOSIS — Z7901 Long term (current) use of anticoagulants: Secondary | ICD-10-CM

## 2012-03-30 DIAGNOSIS — I6350 Cerebral infarction due to unspecified occlusion or stenosis of unspecified cerebral artery: Secondary | ICD-10-CM

## 2012-03-30 LAB — POCT INR: INR: 4.1

## 2012-04-04 ENCOUNTER — Encounter: Payer: Self-pay | Admitting: Internal Medicine

## 2012-04-04 ENCOUNTER — Other Ambulatory Visit (INDEPENDENT_AMBULATORY_CARE_PROVIDER_SITE_OTHER): Payer: Medicare Other

## 2012-04-04 ENCOUNTER — Ambulatory Visit (INDEPENDENT_AMBULATORY_CARE_PROVIDER_SITE_OTHER): Payer: Medicare Other | Admitting: Internal Medicine

## 2012-04-04 VITALS — BP 120/80 | HR 116 | Temp 97.4°F | Resp 16 | Wt 103.0 lb

## 2012-04-04 DIAGNOSIS — R1310 Dysphagia, unspecified: Secondary | ICD-10-CM

## 2012-04-04 DIAGNOSIS — I10 Essential (primary) hypertension: Secondary | ICD-10-CM

## 2012-04-04 DIAGNOSIS — R7309 Other abnormal glucose: Secondary | ICD-10-CM

## 2012-04-04 DIAGNOSIS — F0390 Unspecified dementia without behavioral disturbance: Secondary | ICD-10-CM

## 2012-04-04 DIAGNOSIS — Z7901 Long term (current) use of anticoagulants: Secondary | ICD-10-CM

## 2012-04-04 DIAGNOSIS — I5020 Unspecified systolic (congestive) heart failure: Secondary | ICD-10-CM

## 2012-04-04 DIAGNOSIS — I428 Other cardiomyopathies: Secondary | ICD-10-CM

## 2012-04-04 DIAGNOSIS — I429 Cardiomyopathy, unspecified: Secondary | ICD-10-CM

## 2012-04-04 DIAGNOSIS — I4891 Unspecified atrial fibrillation: Secondary | ICD-10-CM

## 2012-04-04 DIAGNOSIS — R627 Adult failure to thrive: Secondary | ICD-10-CM

## 2012-04-04 LAB — COMPREHENSIVE METABOLIC PANEL
ALT: 25 U/L (ref 0–35)
Albumin: 3.5 g/dL (ref 3.5–5.2)
CO2: 30 mEq/L (ref 19–32)
GFR: 51.73 mL/min — ABNORMAL LOW (ref >60.00)
Glucose, Bld: 111 mg/dL — ABNORMAL HIGH (ref 70–99)
Potassium: 4.5 mEq/L (ref 3.5–5.1)
Sodium: 142 mEq/L (ref 135–145)
Total Bilirubin: 1 mg/dL (ref 0.3–1.2)
Total Protein: 7.2 g/dL (ref 6.0–8.3)

## 2012-04-04 LAB — TSH: TSH: 1.4 u[IU]/mL (ref 0.35–5.50)

## 2012-04-04 MED ORDER — MEGESTROL ACETATE 40 MG/ML PO SUSP: 400.0000 mg | Freq: Every day | ORAL | Status: DC

## 2012-04-04 NOTE — Progress Notes (Signed)
Subjective:    Patient ID: [name], female    DOB: 19-Oct-1920, 78 y.o.   MRN: [id number]  HPI [name] presents for chronic pain - all over but also related to recent falls: fell hitting the ground with her buttocks, fell and landed on her hip and arm. She has had imaging studies of hip, back, not the arm. Reviewed imaging: hips - w/o fracture, L-S spine with old compression fractures but no new fractures. MRI revealed torn adductor muscles both hips.   She is having increased confusion, disorientation, question of hallucinosis. She has lost her appetite and does not always remember eating. She is generally exhausted , sleeping constantly and when awake and mobile will sometimes just be unable to go another step - her legs won't carry her.   Past Medical History  Diagnosis Date  . History of breast cancer     Hx of left, s/p lumpectomy '08 also XRT  . History of CVA (cerebrovascular accident) 2008  . Urinary incontinence     irritable bladder  . PVD (peripheral vascular disease)     most recent ABI 0.79 on the right and 0.53 on the  left  . Hip fracture 2009    left  . Atrial fibrillation   . CHF (congestive heart failure)   . Hypertension   . Osteoporosis   . CVA (cerebral vascular accident) 2008  . Peripheral vascular disease   . Urinary incontinence   . Hyperlipidemia   . Dementia   . Cancer of breast, female    Past Surgical History  Procedure Date  . Breast lumpectomy   . Orif left hip 07/2008  . Cystoscopy     w/botox  . Tonsillectomy   . Breast lumpectomy     left breast  . Orif left hip 2009  . Eye surgery     cataracts   Family History  Problem Relation Age of Onset  . Heart disease Mother 44  . Breast cancer Mother    History   Social History  . Marital Status: Widowed    Spouse Name: N/A    Number of Children: N/A  . Years of Education: N/A   Occupational History  . 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  Smoking status: Never Smoker   . Smokeless tobacco: Not on file  . Alcohol Use: No  . Drug Use: No  . Sexually Active: Yes    Birth Control/ Protection: Post-menopausal   Other Topics Concern  . Not on file   Social History Narrative   ** Merged History Encounter ** Has supportive daughterLives with granddaughter - widowed 2007, after 61 years of marriage      Review of Systems System review is negative for any constitutional, cardiac, pulmonary, GI or neuro symptoms or complaints other than as described in the HPI.     Objective:   Physical Exam Filed Vitals:   04/04/12 1200  BP: 120/80  Pulse: 116  Temp: 97.4 F (36.3 C)  Resp: 16   Wt Readings from Last 3 Encounters:  04/04/12 103 lb (46.72 kg)  03/01/12 106 lb (48.081 kg)  12/27/11 105 lb (47.628 kg)  Since Dec '12 - loss of 21 lbs.  Gen'l- very thin and elderly white woman in no acute distress HEENT- no signs of trauma, C&S clear Cor- 2+ radial pulse, RRR Pulm - normal respirations abd - soft MSK - decreased active and passive ROM left shoulder. Derm. -  bruising left chest wall and proximal UE>         Assessment & Plan:

## 2012-04-04 NOTE — Patient Instructions (Signed)
Shoulder pain - very concerned for possible rotator cuff tear/injury with decreased range of motion.  Plan - tylenol 500 mg every 4 hours with a limit of 6 tablets per day  Generalized pain - will stick with tylenol but if it gets worse we can try low dose narcotics.  Failure to thrive - will check thyroid and chemistry labs to day. Will prescribe Megace 400 mg once a day for appetite. Try to be active but I won't make an issue of it.   Fall risk and blood thinnger - risk of injury exceeds benefit of stroke prevention. STOP THE COUMADIN.

## 2012-04-05 DIAGNOSIS — R627 Adult failure to thrive: Secondary | ICD-10-CM | POA: Insufficient documentation

## 2012-04-05 NOTE — Assessment & Plan Note (Signed)
Rate controlled today.  Plan- will d/c coumadin

## 2012-04-05 NOTE — Assessment & Plan Note (Signed)
Continued problem which her granddaughter manages well - soft foods, thickened beverage.

## 2012-04-05 NOTE — Assessment & Plan Note (Signed)
Patient on coumadin for a. Fib and stroke risk reduction. She has had multiple falls and remains at very high risk for falls. Risk of treatment outweighs benefit.  Plan  D/C coumadin.

## 2012-04-05 NOTE — Assessment & Plan Note (Signed)
Patient doing well with Namenda. She is awake today, recognizes examiner, has insight into her condition, "31 is too old."  Plan -continue present medication

## 2012-04-05 NOTE — Assessment & Plan Note (Signed)
BP Readings from Last 3 Encounters:  04/04/12 120/80  03/07/12 122/82  03/01/12 108/72

## 2012-04-05 NOTE — Assessment & Plan Note (Signed)
Patient with failure to thrive: increased sleeping, poor appetite, disengaging from activities in setting of advanced age with multiple co morbidities including heart failure.  Plan  Apprised grand daughter of poor prognosis  Will start megace  Advised patient that our goal is comfort care.

## 2012-04-05 NOTE — Assessment & Plan Note (Signed)
Very limited physical capacity. No acute SOB or evidence of decompensation.

## 2012-05-05 ENCOUNTER — Other Ambulatory Visit: Payer: Self-pay | Admitting: Cardiology

## 2012-05-06 ENCOUNTER — Ambulatory Visit: Payer: Self-pay | Admitting: Cardiology

## 2012-05-06 DIAGNOSIS — Z7901 Long term (current) use of anticoagulants: Secondary | ICD-10-CM

## 2012-05-06 DIAGNOSIS — I4891 Unspecified atrial fibrillation: Secondary | ICD-10-CM

## 2012-05-06 DIAGNOSIS — I6350 Cerebral infarction due to unspecified occlusion or stenosis of unspecified cerebral artery: Secondary | ICD-10-CM

## 2012-05-06 NOTE — Telephone Encounter (Signed)
Called spoke with pt's daughter, she states pt's primary care MD has discontinued pt's Coumadin.  Pt is no longer on Coumadin.

## 2012-05-10 ENCOUNTER — Other Ambulatory Visit: Payer: Self-pay | Admitting: Internal Medicine

## 2012-06-24 ENCOUNTER — Ambulatory Visit (INDEPENDENT_AMBULATORY_CARE_PROVIDER_SITE_OTHER): Payer: Medicare Other | Admitting: Internal Medicine

## 2012-06-24 ENCOUNTER — Encounter: Payer: Self-pay | Admitting: Internal Medicine

## 2012-06-24 ENCOUNTER — Telehealth: Payer: Self-pay | Admitting: Internal Medicine

## 2012-06-24 VITALS — BP 102/64 | HR 88 | Temp 98.4°F | Wt 92.1 lb

## 2012-06-24 DIAGNOSIS — L89309 Pressure ulcer of unspecified buttock, unspecified stage: Secondary | ICD-10-CM

## 2012-06-24 DIAGNOSIS — F0390 Unspecified dementia without behavioral disturbance: Secondary | ICD-10-CM

## 2012-06-24 DIAGNOSIS — L8990 Pressure ulcer of unspecified site, unspecified stage: Secondary | ICD-10-CM

## 2012-06-24 DIAGNOSIS — R627 Adult failure to thrive: Secondary | ICD-10-CM

## 2012-06-24 NOTE — Telephone Encounter (Signed)
aller: Jessamyn/grandaughter; PCP: [name]; CB#: [phone]; Calling today 06/24/12 regarding called on 06/20/12 and no one called her back.  Says grandmother has sore areas on her bottom, thinks they may be pressure ulcers, they have developed slowly.  Onset about 3 weeks.  Skin has been broken for about 2 weeks.  Has one on each buttock.  One about the size of a quarter and one the size of a dime.  Sores are draining. Afebrile. Emergent symptoms r/o by Skin Lesions guidelines with exception of any skin lesion with signs and symptoms of worsening infection.  Appt scheduled for today 06/24/12 at 2 PM with Rene Paci.

## 2012-06-24 NOTE — Progress Notes (Signed)
  Subjective:    Patient ID: [name], female    DOB: 01-Sep-1920, 78 y.o.   MRN: [id number]  HPI  complains of wounds on bottom (per g-dtr, pt not verbal) Located both buttocks - upper edge Onset 3-4 weeks ago - not enlarging in size but slow to improve Working on home care: neosporin, washing and repositioning in bed/chair No fever, no drainage, mild pain but controlled by tylenol  Past Medical History  Diagnosis Date  . History of breast cancer     Hx of left, s/p lumpectomy '08 also XRT  . History of CVA (cerebrovascular accident) 2008  . Urinary incontinence     irritable bladder  . PVD (peripheral vascular disease)     most recent ABI 0.79 on the right and 0.53 on the  left  . Hip fracture 2009    left  . Atrial fibrillation   . CHF (congestive heart failure)   . Hypertension   . Osteoporosis   . CVA (cerebral vascular accident) 2008  . Peripheral vascular disease   . Urinary incontinence   . Hyperlipidemia   . Dementia   . Cancer of breast, female     Review of Systems  Constitutional: Positive for appetite change, fatigue and unexpected weight change. Negative for fever.  Respiratory: Negative for cough and shortness of breath.   Skin: Positive for wound. Negative for color change, pallor and rash.  Neurological: Negative for weakness.       Objective:   Physical Exam BP 102/64  Pulse 88  Temp 98.4 F (36.9 C) (Oral)  Wt 92 lb 1.3 oz (41.767 kg)  SpO2 99% Constitutional: She is frail, sitting in WC. No distress. G-dtr at side - pt largely nonverbal/withdrawn but follows commands when asked by g-dtr Cardiovascular: Normal rate, regular rhythm and normal heart sounds.  No murmur heard. No BLE edema. Pulmonary/Chest: Effort normal and breath sounds normal. No respiratory distress. She has no wheezes.  Skin: B upper buttock with small stage 2 pressure ulcers. L 11x5 mm and R 6x67mm - no cellulitis or other wounds remaining skin is warm and dry. No rash  noted. No erythema.   Lab Results  Component Value Date   WBC 6.1 03/07/2012   HGB 12.8 03/07/2012   HCT 39.3 03/07/2012   PLT 194.0 03/07/2012   GLUCOSE 111* 04/04/2012   CHOL 231* 12/12/2008   TRIG 71 12/12/2008   HDL 64.1 12/12/2008   LDLDIRECT 151.4 12/12/2008   LDLCALC 83 02/20/2008   ALT 25 04/04/2012   AST 31 04/04/2012   NA 142 04/04/2012   K 4.5 04/04/2012   CL 100 04/04/2012   CREATININE 1.1 04/04/2012   BUN 22 04/04/2012   CO2 30 04/04/2012   TSH 1.40 04/04/2012   INR 4.1 03/30/2012   HGBA1C 6.0* 10/26/2011        Assessment & Plan:  Pressure ulcer, stage 2 - B buttock: 1.76mm L and 6 mm R No signs infection Education and reassurance provided Arrange HHRN for wound care and supplies Hold empiric antibiotics unless worse - g-dtr understands same  FTT/advanced dementia - reviewed importance of nutrition efforts with family, esp in setting of wound care and prevention - family will continue diligent work on same but obviously complicated by dementia issues -

## 2012-06-24 NOTE — Patient Instructions (Addendum)
It was good to see you today. we'll make referral to home health for wound care nurse to help with pressure ulcer care. Our office will contact you regarding appointment(s) once made.  Pressure Ulcer A pressure ulcer is a sore where the skin breaks down and exposes deeper layers of tissue. It develops in areas of the body where there is unrelieved pressure. Pressure ulcers are usually found over a boney area, such as the shoulder blades, spine, lower back, hips, knees, ankles, and heels. CAUSES    Decreased ability to move.   Decreased ability to feel pain or discomfort.   Moisture from urine or stool.   Poor nutrition.   Pulling sheets that are under a patient when changing his or her position.  STAGING PRESSURE ULCERS Your caregiver may determine the degree of severity (stage) of your pressure ulcer. The stages include:  Stage 1: The skin is red, and when the skin is pressed, it stays red.   Stage 2: The top layer of skin is gone, and there is a shallow, pink ulcer.   Stage 3: The ulcer becomes deeper, and it is more difficult to see the whole wound. Also, there may be yellow or brown parts, as well as pink and red parts.   Stage 4: The ulcer may be deep and red, pink, brown, white, or yellow. Bone or muscle may be seen.   Unstageable pressure ulcer: The ulcer is covered almost completely with black, brown, or yellow tissue. It is not known how deep the ulcer is or what stage it is until this covering comes off.   Suspected deep tissue injury: A patient's skin can be injured from pressure or pulling on the skin when his or her position is changed. The skin appears purple or maroon. There may not be an opening in the skin, but there could be a blood-filled blister. This deep tissue injury is often difficult to see in people with darker skin tones. The skin may go back to normal when pressure is relieved, or the site may open up and become deeper in time.  DIAGNOSIS   Your caregiver  will diagnose your pressure ulcer based on its appearance. Your caregiver may determine the stage of your pressure ulcer as well. Your caregiver may request tests to check for infection, assess your circulation, or to check for other diseases, such as diabetes. TREATMENT   Treatment of your pressure ulcer begins with determining what stage the ulcer is in. Your treatment team may include your caregiver, a wound care specialist, a nutritionist, a physical therapist, and a Careers adviser. Treatments include:    Moving or repositioning every 1 to 2 hours.   Using beds or mattresses to shift your body weight and pressure points frequently.   Improving your diet.   Cleaning and bandaging (dressing) the open wound.   Giving antibiotic medicines.   Removing damaged tissue.   Surgery and sometimes skin grafts.  HOME CARE INSTRUCTIONS  Follow the care plan that was started in the hospital.   Avoid staying in the same position for more than 2 hours. Use padding, devices, or mattresses to cushion your pressure points as directed by your caregiver.   Eat well. Take nutritional supplements and vitamins as directed by your caregiver.   Keep all follow-up appointments.   Take pain medicine as directed by your caregiver.  SEEK MEDICAL CARE IF:    Your pressure ulcer is not improving.   You do not know how to care for  your pressure ulcer.   You notice other areas of redness on your skin.  SEEK IMMEDIATE MEDICAL CARE IF:    You have increasing redness, swelling, or pain in your pressure ulcer.   You notice pus, or increased pus, coming from your pressure ulcer.   You have a fever.   You notice a bad smell coming from the wound or dressing.   Your pressure ulcer opens up again.  MAKE SURE YOU:    Understand these instructions.   Will watch your condition.   Will get help right away if you are not doing well or get worse.  Document Released: 11/09/2005 Document Revised: 10/29/2011 Document  Reviewed: 06/26/2011 [facility] Patient Information 2012 [place], [place].

## 2012-06-28 ENCOUNTER — Telehealth: Payer: Self-pay

## 2012-06-28 NOTE — Telephone Encounter (Signed)
[facility]N called requesting PT evaluation, verbal okay

## 2012-06-29 NOTE — Telephone Encounter (Signed)
OK 

## 2012-06-29 NOTE — Telephone Encounter (Signed)
Verbal order given to [facility] for PT evaluation

## 2012-07-19 ENCOUNTER — Telehealth: Payer: Self-pay | Admitting: Internal Medicine

## 2012-07-19 NOTE — Telephone Encounter (Signed)
Very reliable care-taker - granddaughter.  OK to treat empirically: Cipro 250 mg bid x 10 days Call if symptoms do not improve 24-36 hrs

## 2012-07-19 NOTE — Telephone Encounter (Signed)
Caller: Jessamyn/Other; Patient Name: [name]; PCP: [name] (Adults only); Best Callback Phone Number: [phone] Pt is having symptoms of a bladder infection: she has been having a higher than normal pulse and lower than normal BP ; increased sleepiness and a foul odor to her urine. Pt was just assessed by the Rn who was doing home care - but had her last visit today for decubitus ulcers. Pt is afebrile. The Grandaughter is calling to ask if she could bring a urine specimen to the office for it to be checked rather that bringing her Grandmother who is not mobile. Pt has a hx of frequent bladder infections and this is the typical pattern. RN advised appt but grandaughter insisted note be sent with request since office visit is challenging. She is not present with the Grandmother at this time to do a triage. RN sent request of office to f/u.

## 2012-07-21 MED ORDER — CIPROFLOXACIN HCL 250 MG PO TABS: 250.0000 mg | ORAL_TABLET | Freq: Two times a day (BID) | ORAL | Status: AC

## 2012-07-21 NOTE — Telephone Encounter (Signed)
Rx sent to pharmacy, daughter advised of same

## 2012-08-01 ENCOUNTER — Telehealth: Payer: Self-pay

## 2012-08-01 NOTE — Telephone Encounter (Signed)
Ok for HHPT extension

## 2012-08-01 NOTE — Telephone Encounter (Signed)
HHPT informed

## 2012-08-01 NOTE — Telephone Encounter (Signed)
HHPT is requesting verbal orders to extend visits x 2 weeks

## 2012-09-01 ENCOUNTER — Ambulatory Visit (INDEPENDENT_AMBULATORY_CARE_PROVIDER_SITE_OTHER): Payer: Medicare Other | Admitting: Internal Medicine

## 2012-09-01 ENCOUNTER — Encounter: Payer: Self-pay | Admitting: Internal Medicine

## 2012-09-01 VITALS — BP 112/60 | HR 95 | Temp 97.6°F | Resp 12

## 2012-09-01 DIAGNOSIS — J069 Acute upper respiratory infection, unspecified: Secondary | ICD-10-CM

## 2012-09-01 MED ORDER — AMOXICILLIN-POT CLAVULANATE 250-62.5 MG/5ML PO SUSR: 500.0000 mg | Freq: Two times a day (BID) | ORAL | Status: DC

## 2012-09-01 NOTE — Patient Instructions (Addendum)
Rapid decline in alertness and interaction is very likely to be infection. The normal markers are not reliable in a nonogenarian and trying to be parsimonius in lab work due to the pain that inflicts will treat empirically with antibiotics, choosing a product that will cover respiratory and urinary tract infections.  Plan Augmentin elixir 10 cc twice a day for 7 days  Encourage fluids  Call for any decline in condition - the mutual goal is to avoid hospitalization if at all possible.

## 2012-09-02 ENCOUNTER — Other Ambulatory Visit: Payer: Self-pay | Admitting: Internal Medicine

## 2012-09-05 NOTE — Progress Notes (Signed)
Subjective:    Patient ID: [name], female    DOB: [birth year], 78 y.o.   MRN: [id number]  HPI [name] has had a change in level of function and alertness over the past several days leading her family to bring her in for assessment of possible infection. She has had a fall off in appetite as well. No documented fever, chills, N/V/D. Mild cough that is really not productive. No complaints of pain or discomfort.  Past Medical History  Diagnosis Date  . History of breast cancer     Hx of left, s/p lumpectomy '08 also XRT  . History of CVA (cerebrovascular accident) 2008  . Urinary incontinence     irritable bladder  . PVD (peripheral vascular disease)     most recent ABI 0.79 on the right and 0.53 on the  left  . Hip fracture 2009    left  . Atrial fibrillation   . CHF (congestive heart failure)   . Hypertension   . Osteoporosis   . CVA (cerebral vascular accident) 2008  . Peripheral vascular disease   . Urinary incontinence   . Hyperlipidemia   . Dementia   . Cancer of breast, female    Past Surgical History  Procedure Date  . Breast lumpectomy   . Orif left hip 07/2008  . Cystoscopy     w/botox  . Tonsillectomy   . Breast lumpectomy     left breast  . Orif left hip 2009  . Eye surgery     cataracts   Family History  Problem Relation Age of Onset  . Heart disease Mother 80  . Breast cancer Mother    History   Social History  . Marital Status: Widowed    Spouse Name: N/A    Number of Children: N/A  . Years of Education: N/A   Occupational History  . 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 Smoking status: Never Smoker   . Smokeless tobacco: Not on file  . Alcohol Use: No  . Drug Use: No  . Sexually Active: Yes    Birth Control/ Protection: Post-menopausal   Other Topics Concern  . Not on file   Social History Narrative   ** Merged History Encounter ** Has supportive daughterLives with granddaughter - widowed 2007,  after 61 years of marriage    Current Outpatient Prescriptions on File Prior to Visit  Medication Sig Dispense Refill  . acetaminophen (TYLENOL) 500 MG tablet Take 500 mg by mouth every 6 (six) hours as needed. For pain.      . bisacodyl (DULCOLAX) 5 MG EC tablet Take 5 mg by mouth daily as needed. For constipation.      . furosemide (LASIX) 20 MG tablet Take 20 mg by mouth every morning.      . memantine (NAMENDA) 10 MG tablet Take 10 mg by mouth 2 (two) times daily.       . metoprolol tartrate (LOPRESSOR) 25 MG tablet Take 12.5 mg by mouth 2 (two) times daily.      . polyvinyl alcohol (LIQUIFILM TEARS) 1.4 % ophthalmic solution Place 1 drop into both eyes daily.          Review of Systems System review is negative for any constitutional, cardiac, pulmonary, GI or neuro symptoms or complaints other than as described in the HPI.     Objective:   Physical Exam Filed Vitals:   09/01/12 1646  BP: 112/60  Pulse: 95  Temp: 97.6 F (36.4 C)  Resp: 12   Gen'l- Very old white woman in a w/c arousable but very soft spoken. She does seem to be oriented but it is hard to tell for sure. Cor- 1+ radial pulse, heart sounds distant Pulm - no increased WOB, no wheeze or rales Abd- soft, non-tender Neuro - decreased level of alertness and interaction from her baseline. No focal deficits.       Assessment & Plan:  Infection - with mental status and energy level change she is presumed to have an infection, possibly respiratory although cannot r/o UTI.  Plan -  Augmentin elixir bid x 7 days  Family to call for any worsening of condition. For failure to improve will need to consider in-pt evaluation.

## 2012-09-27 ENCOUNTER — Other Ambulatory Visit: Payer: Self-pay | Admitting: Internal Medicine

## 2012-10-18 ENCOUNTER — Other Ambulatory Visit: Payer: Self-pay | Admitting: Internal Medicine

## 2012-10-26 ENCOUNTER — Telehealth: Payer: Self-pay | Admitting: Internal Medicine

## 2012-10-26 NOTE — Telephone Encounter (Signed)
Please read call-a-nurse note below.  

## 2012-10-26 NOTE — Telephone Encounter (Signed)
Called [name] - got voice mail and left msg. Called [name] - daughter - left message.  Will try calling in AM.

## 2012-10-26 NOTE — Telephone Encounter (Signed)
Patient Information:  Caller Name: Enis Slipper  Phone: [phone]  Patient: [name], [name]  Gender: Female  DOB: 21-Sep-1920  Age: 78 Years  PCP: [name] (Adults only)   Symptoms  Reason For Call & Symptoms: c/o cough; congested; weaker than usual  Reviewed Health History In EMR: Yes  Reviewed Medications In EMR: Yes  Reviewed Allergies In EMR: Yes  Reviewed Surgeries / Procedures: Yes  Date of Onset of Symptoms: 10/19/2012  Treatments Tried: Tussinex, Vicks vapor rub, Humidifier  Treatments Tried Worked: No  Guideline(s) Used:  Cough  Disposition Per Guideline:   Go to Office Now  Reason For Disposition Reached:   Wheezing is present  Advice Given:  Reassurance  Coughing is the way that our lungs remove irritants and mucus. It helps protect our lungs from getting pneumonia.  Coughing Spasms:  Drink warm fluids. Inhale warm mist (Reason: both relax the airway and loosen up the phlegm).  Prevent Dehydration:  Drink adequate liquids.  Avoid Tobacco Smoke:  Smoking or being exposed to smoke makes coughs much worse.  Call Back If:  Difficulty breathing  Call Back If:  Difficulty breathing  You become worse.  Office Follow Up:  Does the office need to follow up with this patient?: Yes  Instructions For The Office: Granddaughter says that Dr.Norins has told them to call prior to making an appt, because it may be something that she doesn't need to come in for; prefers that he decide if pt should be seen  RN Overrode Recommendation:  Follow Up With Office Later  Would like Dr.Norins opinion prior to making an appt

## 2012-11-12 ENCOUNTER — Other Ambulatory Visit: Payer: Self-pay | Admitting: Internal Medicine

## 2012-11-14 ENCOUNTER — Other Ambulatory Visit: Payer: Self-pay | Admitting: *Deleted

## 2012-11-14 MED ORDER — MEMANTINE HCL 10 MG PO TABS: 10.0000 mg | ORAL_TABLET | Freq: Two times a day (BID) | ORAL | Status: DC

## 2012-12-07 ENCOUNTER — Other Ambulatory Visit: Payer: Self-pay | Admitting: Internal Medicine

## 2013-01-28 ENCOUNTER — Other Ambulatory Visit: Payer: Self-pay | Admitting: Internal Medicine

## 2013-02-25 ENCOUNTER — Other Ambulatory Visit: Payer: Self-pay | Admitting: Internal Medicine

## 2013-03-07 ENCOUNTER — Telehealth: Payer: Self-pay | Admitting: Internal Medicine

## 2013-03-07 ENCOUNTER — Telehealth: Payer: Self-pay

## 2013-03-07 NOTE — Telephone Encounter (Signed)
Received message via triage stating that pt has injured her elbow. Caller stated that Dr. Debby Bud usually makes house calls due to pt's age but since he is out of office caller requested a RN come out to pt home to evaluate injury. I returned called as specified via VM, no answer or VM.

## 2013-03-07 NOTE — Telephone Encounter (Signed)
Patient Information:  Caller Name: [name]  Phone: [phone]  Patient: [name], [name]  Gender: Female  DOB: 1920-11-11  Age: 79 Years  PCP: [name] (Adults only)  Office Follow Up:  Does the office need to follow up with this patient?: Yes  Instructions For The Office: Dr. Debby Bud normally makes a home visit to patient. They are asking someone to come look at skin tear abrasions sustained yesterday.  RN Note:  Care giver states that Dr. Debby Bud normally comes to home to see patient because she is very frail and weak. Dr. Debby Bud prefer she not come to the office or go to theER.  She needs sometime to come to home and assess the status of the arm.  Please contact if Nurse or physician can come and evaluate wound and dress.  Symptoms  Reason For Call & Symptoms: Caregiver [name] is calling in about [name].  She states Dr. Debby Bud normally makes a home visit to to see the  patient.  They are requesting that some see her (1) She was sitting in a new wheelchair.  She wrapped her arm around the wheelchair and has sustained  abrasion/skin tear  to the top left arm x3 areas.  ( A. ) (above the elbow)  2x2 area . (B.) Below the the first wound size of her of her fingernail and (C)  inside the elbow and size of fingernail.  Onset yesterday 03/06/13.  They washed with water , neosporin and gauze .  No bleeding, minor discomfort, no pus noted.  Reviewed Health History In EMR: Yes  Reviewed Medications In EMR: Yes  Reviewed Allergies In EMR: Yes  Reviewed Surgeries / Procedures: Yes  Date of Onset of Symptoms: 03/06/2013  Treatments Tried: Neosporin, washed with water, non stick bandange  Treatments Tried Worked: No  Guideline(s) Used:  Arm Injury  Disposition Per Guideline:   See Today or Tomorrow in Office  Reason For Disposition Reached:   High-risk adult (e.g., age > 21, osteoporosis, chronic steroid use)  Advice Given:  Cleaning the Wound:  Wash the wound with soap and water for 5  minutes.  For any dirt, scrub gently with a wash cloth.  For any bleeding, apply direct pressure with a sterile gauze or clean cloth for 10 minutes.  Bleeding  : Apply direct pressure for 10 minutes with a sterile gauze to stop any bleeding.  Reassurance - Superficial Laceration (Cut or Scratch) or Abrasion (Scrape):  This sounds like a small cut or scrape that we can treat at home.  Here is some care advice that should help.  Antibiotic Ointment  Apply an Antibiotic Ointment (e.g., OTC Bacitracin), covered by a Band-Aid or dressing. Change daily or if it becomes wet.  Option: A TEFLA dressing won't stick to the wound when it is removed.  Call Back If:  Looks infected (pus, redness, increasing tenderness)  Doesn't heal within 10 days  You become worse.  RN Overrode Recommendation:  Make Appointment  Dr. Debby Bud normally goes to see patient in her home.  She understands that he is on vacation. She is requesting a Nurse come by and assess wound and bandage. Please contact-

## 2013-03-08 NOTE — Telephone Encounter (Signed)
Dr Debby Bud will be n/a for 6 wks due to sickness. Dress wound w/abx oint and Telfa pad. OV if needed pls Thx

## 2013-03-08 NOTE — Telephone Encounter (Signed)
Phone call to Kriste Basque (care giver) [phone] letting her know Dr Debby Bud is out of the office until June so there is no one to come visit the pt in her home. Kriste Basque states she has been dressing the wound with ointment and gauze. She states she will continue to due this since it seems to be working fine and will call back either Thurs or Fri if she feels pt needs an appt. There is no oozing or bleeding currently. Becky had no other concerns.

## 2013-03-14 ENCOUNTER — Other Ambulatory Visit: Payer: Self-pay | Admitting: Internal Medicine

## 2013-04-04 ENCOUNTER — Other Ambulatory Visit: Payer: Self-pay | Admitting: Internal Medicine

## 2013-04-13 ENCOUNTER — Telehealth: Payer: Self-pay | Admitting: Internal Medicine

## 2013-04-13 NOTE — Telephone Encounter (Signed)
FYIDonnamarie Poag called to say she and Vance Gather will be out of town.  Becky Kesler and Allee Rudd will be with the Patient.  They will be taking care of her and will be with her if she need to come in or go to the hospital.  She just wants [place] to be aware.  You can use the home number or Allee's cell- [phone].  Donnamarie Poag will be back home Tues.  They will check their phone messages.

## 2013-04-13 NOTE — Telephone Encounter (Signed)
Ok to forward to Dr Debby Bud

## 2013-04-14 ENCOUNTER — Telehealth: Payer: Self-pay | Admitting: Internal Medicine

## 2013-04-14 NOTE — Telephone Encounter (Signed)
Patient Information:  Caller Name: Allee  Phone: [phone]  Patient: [name], [name]  Gender: Female  DOB: [birth year]  Age: 79 Years  PCP: [name] (Adults only)  Office Follow Up:  Does the office need to follow up with this patient?: No  Instructions For The Office: N/A  RN Note:  Patient is eating, drinking, voiding, and interacting within normal limits. Intermittent cough.  Symptoms  Reason For Call & Symptoms: cough, chest congestion  Reviewed Health History In EMR: Yes  Reviewed Medications In EMR: Yes  Reviewed Allergies In EMR: Yes  Reviewed Surgeries / Procedures: Yes  Date of Onset of Symptoms: 04/09/2013  Treatments Tried: Robitussin, humidifier, sitting up  Treatments Tried Worked: Yes  Guideline(s) Used:  Cough  Disposition Per Guideline:   Home Care  Reason For Disposition Reached:   Cough with cold symptoms (e.g., runny nose, postnasal drip, throat clearing)  Advice Given:  Reassurance  Coughing is the way that our lungs remove irritants and mucus. It helps protect our lungs from getting pneumonia.  You can get a dry hacking cough after a chest cold. Sometimes this type of cough can last 1-3 weeks, and be worse at night.  You can also get a cough after being exposed to irritating substances like smoke, strong perfumes, and dust.  Here is some care advice that should help.  OTC Cough Syrup - Dextromethorphan:  Cough syrups containing the cough suppressant dextromethorphan (DM) may help decrease your cough. Cough syrups work best for coughs that keep you awake at night. They can also sometimes help in the late stages of a respiratory infection when the cough is dry and hacking. They can be used along with cough drops.  Examples: Benylin, Robitussin DM, Vicks 44 Cough Relief  Read the package instructions for dosage, contraindications, and other important information.  Cough Medicines:  Home Remedy - Honey: This old home remedy has been shown to  help decrease coughing at night. The adult dosage is 2 teaspoons (10 ml) at bedtime. Honey should not be given to infants under one year of age.  Coughing Spasms:  Drink warm fluids. Inhale warm mist (Reason: both relax the airway and loosen up the phlegm).  Suck on cough drops or hard candy to coat the irritated throat.  Prevent Dehydration:  Drink adequate liquids.  This will help soothe an irritated or dry throat and loosen up the phlegm.  Avoid Tobacco Smoke:  Smoking or being exposed to smoke makes coughs much worse.  Expected Course:   The expected course depends on what is causing the cough.  Viral bronchitis (chest cold) causes a cough that lasts 1 to 3 weeks. Sometimes you may cough up lots of phlegm (sputum, mucus). The mucus can normally be white, gray, yellow, or green.  Call Back If:  Difficulty breathing  Cough lasts more than 3 weeks  Fever lasts > 3 days  You become worse.  Patient Will Follow Care Advice:  YES

## 2013-04-18 NOTE — Telephone Encounter (Signed)
Noted  

## 2013-04-19 ENCOUNTER — Telehealth: Payer: Self-pay

## 2013-04-19 NOTE — Telephone Encounter (Signed)
Patient's granddaughter and caregiver called regarding a cough patient has had for 2 weeks. She wants to know if you will come see her or does she need to bring her in? Please advise.

## 2013-04-19 NOTE — Telephone Encounter (Signed)
Home visit Friday if they think she can wait that long. If not she can be added on to Thursday schedule.

## 2013-04-20 NOTE — Telephone Encounter (Signed)
Patient's granddaughter called back and said instead of bringing patient into the office today, patient can wait until you go see her tomorrow

## 2013-04-20 NOTE — Telephone Encounter (Signed)
Left message for patient's granddaughter about either being seen today or if patient can wait until Friday to call us back and let us know either way

## 2013-04-21 ENCOUNTER — Encounter: Payer: Self-pay | Admitting: Internal Medicine

## 2013-04-21 DIAGNOSIS — J189 Pneumonia, unspecified organism: Secondary | ICD-10-CM | POA: Insufficient documentation

## 2013-04-21 DIAGNOSIS — Z515 Encounter for palliative care: Secondary | ICD-10-CM

## 2013-04-21 MED ORDER — AZITHROMYCIN 100 MG/5ML PO SUSR: 500.0000 mg | Freq: Every day | ORAL | Status: AC

## 2013-04-21 MED ORDER — MOXIFLOXACIN HCL 400 MG PO TABS: 400.0000 mg | ORAL_TABLET | Freq: Every day | ORAL | Status: AC

## 2013-04-21 MED ORDER — PHENYLEPH-PROMETHAZINE-COD 5-6.25-10 MG/5ML PO SYRP: 5.0000 mL | ORAL_SOLUTION | Freq: Every evening | ORAL | Status: AC | PRN

## 2013-04-21 NOTE — Assessment & Plan Note (Signed)
Patient evaluated at home without benefit of lab or imaging. She does have tachycardia, hypoxemia, increased lethargy, wet cough. Discussed treatment options with patient and grand-daughter (see EoL discussion).  Plan Avelox 400 mg daily x 7  Azithromycin 500 mg daily x 3 days  Home O2 at 2 liters/[facility] contacted and they will try to get oxygen tonight or in AM  Continue to hydrate  PHenergan/codine cough syrup 1/2 tsp at bedtime.  Family to call if she declines.

## 2013-04-21 NOTE — Progress Notes (Signed)
Subjective:    Patient ID: [name], female    DOB: 06/15/1920, 79 y.o.   MRN: [id number]  HPI [name] is seen at home, being too frail to be brought to the office for evaluation.  For the past 11 days, per her grand-daughter and primary care giver, [name] has had a wet cough with out being able to produce sputum. During the day she will cough every 10-15 minutes but at night she coughs more and has not been able to sleep. By report she has not had any fever, has had tachycardia, oxygen saturation to the low 90's high 80's and BP 100/80. She has been more lethargic but has been taking her medications, 27-36 oz fluids daily and boost as well as some food. She has not appeared to be in distress.  Past Medical History  Diagnosis Date  . History of breast cancer     Hx of left, s/p lumpectomy '08 also XRT  . History of CVA (cerebrovascular accident) 2008  . Urinary incontinence     irritable bladder  . PVD (peripheral vascular disease)     most recent ABI 0.79 on the right and 0.53 on the  left  . Hip fracture 2009    left  . Atrial fibrillation   . CHF (congestive heart failure)   . Hypertension   . Osteoporosis   . CVA (cerebral vascular accident) 2008  . Peripheral vascular disease   . Urinary incontinence   . Hyperlipidemia   . Dementia   . Cancer of breast, female    Past Surgical History  Procedure Laterality Date  . Breast lumpectomy    . Orif left hip  07/2008  . Cystoscopy      w/botox  . Tonsillectomy    . Breast lumpectomy      left breast  . Orif left hip  2009  . Eye surgery      cataracts   Family History  Problem Relation Age of Onset  . Heart disease Mother 42  . Breast cancer Mother    History   Social History  . Marital Status: Widowed    Spouse Name: N/A    Number of Children: N/A  . Years of Education: N/A   Occupational History  . 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 Smoking status: Never Smoker   .  Smokeless tobacco: Not on file  . Alcohol Use: No  . Drug Use: No  . Sexually Active: Yes    Birth Control/ Protection: Post-menopausal   Other Topics Concern  . Not on file   Social History Narrative   ** Merged History Encounter **       Has supportive daughter   Lives with granddaughter - widowed 2007, after 51 years of marriage            Current Outpatient Prescriptions on File Prior to Visit  Medication Sig Dispense Refill  . acetaminophen (TYLENOL) 500 MG tablet Take 500 mg by mouth every 6 (six) hours as needed. For pain.      .      . bisacodyl (DULCOLAX) 5 MG EC tablet Take 5 mg by mouth daily as needed. For constipation.      .      . furosemide (LASIX) 20 MG tablet TAKE 1 TABLET (20 MG TOTAL) BY MOUTH 2 (TWO) TIMES DAILY.  60 tablet  5  . megestrol (MEGACE) 40 MG/ML suspension Take 400 mg  by mouth daily. TAKE BY MOUTH DAILY      .      Marland Kitchen      .      . metoprolol tartrate (LOPRESSOR) 25 MG tablet TAKE 1 TABLET TWICE E A DAY AS DIRECTED  60 tablet  5  . NAMENDA 10 MG tablet TAKE 1 TABLET (10 MG TOTAL) BY MOUTH 2 (TWO) TIMES DAILY.  60 tablet  5  . polyvinyl alcohol (LIQUIFILM TEARS) 1.4 % ophthalmic solution Place 1 drop into both eyes daily.       No current facility-administered medications on file prior to visit.         Review of Systems Constitutional:  Negative for fever, chills, and unexpected weight change. More lethargic HEENT:  Negative for increased hearing loss, ear pain, congestion, neck stiffness and postnasal drip. Negative for sore throat or swallowing problems. Negative for dental complaints.   Eyes: Negative for vision loss or change in visual acuity.  Respiratory: Negative for chest tightness and wheezing. Negative for DOE.   Cardiovascular: Negative for chest pain or palpitations.  Gastrointestinal: No change in bowel habit. No bloating or gas. No reflux or indigestion Genitourinary: Negative for urgency, frequency, flank pain and  difficulty urinating.  Musculoskeletal: Negative for myalgias, back pain, arthralgias and gait problem.  Neurological: Negative for dizziness, tremors, weakness and headaches.  Hematological: Negative for adenopathy.  Psychiatric/Behavioral: Negative for behavioral problems and dysphoric mood.     Objective:   Physical Exam Not hot or warm to touch. BP 108/80, Resp 12, HR 147, O2 sat on RA 82% finger oximeter Gen'l- very elderly white woman in her recliner who keeps her eyes closed by follows commands and briefly answers questions HEENT- lips do not appear dry. Cor- 1+ radial pulse, regular tachycardia Pulm - shallow inspirations, no tacypnea, no apparent rales or wheezing - exam limited by shallow inspirations. Wet cough that is weak and she cannot bring up secretions. Abd- soft Neuro - very withdrawn and lethargic but answers questions and follows commands.      Assessment & Plan:

## 2013-04-24 ENCOUNTER — Telehealth: Payer: Self-pay

## 2013-04-24 NOTE — Telephone Encounter (Signed)
Left message on voicemail that Hydrogel is okay.

## 2013-04-24 NOTE — Telephone Encounter (Signed)
msg reviewed. Many thanks. OK for hydrogel.

## 2013-04-24 NOTE — Telephone Encounter (Signed)
Phone call from [facility] with [facility] [phone]. She plans to see the patient 3 times a week for a week, then 2 times a week for 4 weeks; then 1 time a week for 4 weeks. She states patient has break down to her lower back and hydrocolloid dressing was applied. She states patient has stage 2 to her left toe and is requesting hydrogel. Lastly patient had a major drug interaction with azithromycin and moxifloxacin but the family is now giving them to her at different times of the day and this is working fine.

## 2013-04-25 ENCOUNTER — Telehealth: Payer: Self-pay | Admitting: *Deleted

## 2013-04-25 NOTE — Telephone Encounter (Signed)
I checked pulse ox when making a home visit. Send my note from May 30th

## 2013-04-25 NOTE — Telephone Encounter (Signed)
[employer] needs testing results for 02 sat to send to insurance-if results are available can also be faxedt o [phone]

## 2013-04-26 NOTE — Telephone Encounter (Signed)
OV notes faxed to number provided, [employer] informed.

## 2013-04-28 ENCOUNTER — Telehealth: Payer: Self-pay

## 2013-04-28 NOTE — Telephone Encounter (Addendum)
Phone call from [place], nurse with [facility] [phone]. Pt is being seen for wound care and nursing. Her blood pressure today is 94/56 pulse is 72 and axillary temp is 96.4. Wednesday her blood pressure was 100/60. She is more tired than normal. She averages 4 cups of fluid a day and that is including the boost. She finished antibiotics yesterday. The blood pressure medication she is on is Metoprolol 25 mg 1/2 tab BID.

## 2013-04-28 NOTE — Telephone Encounter (Signed)
Left a message with Roxanne on her voice mail with the instructions to hold Metoprolol for 2 days.

## 2013-04-28 NOTE — Telephone Encounter (Signed)
NOted. Havew patient hold metoprolol for 2 days to see if BP comes back up.

## 2013-05-02 ENCOUNTER — Telehealth: Payer: Self-pay | Admitting: *Deleted

## 2013-05-02 NOTE — Telephone Encounter (Signed)
Called roxanne no answer LMOM RTC...[name]

## 2013-05-02 NOTE — Telephone Encounter (Signed)
Left msg on triage stating call last week due to pt BP being low. Was told to hold her metoprolol for 2 days. Saw pt this am she did not take on sat & sun. She took 1/2 of the metoprolol on yesterday. BP this am was 114/64 HR 96 she was more alert today & even talking. Wanting md advisement on HR she states she normally run in her 13's...[name]

## 2013-05-02 NOTE — Telephone Encounter (Signed)
[name] return callback gave md response...[name]

## 2013-05-02 NOTE — Telephone Encounter (Signed)
Continue present treatment - as BP improves will try to resume full dose beta-blocker. Thanks for the update

## 2013-05-11 ENCOUNTER — Telehealth: Payer: Self-pay

## 2013-05-11 NOTE — Telephone Encounter (Signed)
Roxanne notified. Phone call to patient's grand daughter, Fabio Asa and she states it is not in patients best interest to bring her in for an office visit. I let her know if she fells patient is any worse to go to ER. She would like this message forwarded to Dr Debby Bud.

## 2013-05-11 NOTE — Telephone Encounter (Signed)
OV ER visit if worse Thx

## 2013-05-11 NOTE — Telephone Encounter (Signed)
Phone call from [place] at [place] [phone] regarding Dr Debby Bud patient. She states patient has an irregular HR and highest she's gotten is 121. Her feet are swollen. She did take her 1/2 tab Metoprolol today. She denies SOB. Please advise. thanks

## 2013-05-12 NOTE — Telephone Encounter (Signed)
Noted. Will try to see Sunday

## 2013-05-16 ENCOUNTER — Telehealth: Payer: Self-pay | Admitting: *Deleted

## 2013-05-16 DIAGNOSIS — I6350 Cerebral infarction due to unspecified occlusion or stenosis of unspecified cerebral artery: Secondary | ICD-10-CM

## 2013-05-16 DIAGNOSIS — R0989 Other specified symptoms and signs involving the circulatory and respiratory systems: Secondary | ICD-10-CM

## 2013-05-16 DIAGNOSIS — I739 Peripheral vascular disease, unspecified: Secondary | ICD-10-CM

## 2013-05-16 DIAGNOSIS — I4891 Unspecified atrial fibrillation: Secondary | ICD-10-CM

## 2013-05-16 NOTE — Telephone Encounter (Signed)
Roxanne from [facility] called requesting an order for Hospice for evaluation of pt.  Roxanne further states pts family wants to proceed wth hospice for pt. Please advise Roxanne

## 2013-05-16 NOTE — Telephone Encounter (Signed)
Order to [facility] for Hospice Palliative care [place] eval and enrollment. Dr. Debby Bud to be attending. Welcomes co-management with hospice team,,

## 2013-05-19 ENCOUNTER — Telehealth: Payer: Self-pay

## 2013-05-19 NOTE — Telephone Encounter (Signed)
Order sent to [facility] for Hospice

## 2013-05-19 NOTE — Telephone Encounter (Signed)
Phone call from [place] [phone] just needing a hospice diagnosis. She got the referral form faxed today. Per Dr Debby Bud Dx [phone] (dyspnea and respiratory abnormality)

## 2013-06-06 ENCOUNTER — Other Ambulatory Visit: Payer: Self-pay | Admitting: Internal Medicine

## 2013-06-21 ENCOUNTER — Telehealth: Payer: Self-pay | Admitting: *Deleted

## 2013-06-21 NOTE — Telephone Encounter (Signed)
Amy called states pts BP has been running low.  114/80, HR 80 this week; 92/60, 98/70 last week.  Reports pt is still taking Metoprolol 12.5 BID requests whether pt should modify medication.  Please advise

## 2013-06-21 NOTE — Telephone Encounter (Signed)
These BP readings are OK. Continue metoprolol at current dose.

## 2013-06-21 NOTE — Telephone Encounter (Signed)
Left detailed message advising of MDs note.

## 2013-07-19 ENCOUNTER — Telehealth: Payer: Self-pay

## 2013-07-19 NOTE — Telephone Encounter (Signed)
1. OK for indent cream as requested.  2. Home visit Friday - please remind me.

## 2013-07-19 NOTE — Telephone Encounter (Signed)
Rosey Bath with Hospice called lmovm requesting a call back, states that patient will be transferred to Respbid care on 07/28/13. She has been running a low grade fever and has a decubitus ulcer. She is requesting verbal orders for Indent cream(??) to help with ulcer. Patient daughter would also like for PCP to come by and see her before she is trransferred. Thanks

## 2013-07-20 NOTE — Telephone Encounter (Signed)
Hospice nurse notified per MD

## 2013-07-21 ENCOUNTER — Encounter: Payer: Self-pay | Admitting: Internal Medicine

## 2013-07-21 DIAGNOSIS — I429 Cardiomyopathy, unspecified: Secondary | ICD-10-CM

## 2013-07-21 DIAGNOSIS — Z515 Encounter for palliative care: Secondary | ICD-10-CM

## 2013-07-21 DIAGNOSIS — R1310 Dysphagia, unspecified: Secondary | ICD-10-CM

## 2013-07-21 DIAGNOSIS — R627 Adult failure to thrive: Secondary | ICD-10-CM

## 2013-07-21 DIAGNOSIS — I10 Essential (primary) hypertension: Secondary | ICD-10-CM

## 2013-07-23 ENCOUNTER — Encounter: Payer: Self-pay | Admitting: Internal Medicine

## 2013-07-23 NOTE — Progress Notes (Signed)
Subjective:    Patient ID: [name], female    DOB: 1920-01-29, 79 y.o.   MRN: [id number]  HPI Home visit to [name], a 80 y/o with advance FTT now a hospice patient. Daughter had called asking for evaluation due to intermittent fevers.  [name] is in her home, sitting in a wheel chair having just finished lunch. She is awake but barely responsive. The home is a bit cluttered. Her daytime care giver is present and her grand daughter did arrive. The grand daughter is the primary historian. She reports that [name] has a small decubitus ulcer being treated with moisture barrier and by frequent position change in the bed. There have been intermittent low grade fevers to 100 degrees or so. No cough or respiratory symptoms, no change in color or clarity of urine, no abdominal pain. There have been no rigors and no sweats. [name]' appetite is poor but she does eat.  Past Medical History  Diagnosis Date  . History of breast cancer     Hx of left, s/p lumpectomy '08 also XRT  . History of CVA (cerebrovascular accident) 2008  . Urinary incontinence     irritable bladder  . PVD (peripheral vascular disease)     most recent ABI 0.79 on the right and 0.53 on the  left  . Hip fracture 2009    left  . Atrial fibrillation   . CHF (congestive heart failure)   . Hypertension   . Osteoporosis   . CVA (cerebral vascular accident) 2008  . Peripheral vascular disease   . Urinary incontinence   . Hyperlipidemia   . Dementia   . Cancer of breast, female    Past Surgical History  Procedure Laterality Date  . Breast lumpectomy    . Orif left hip  07/2008  . Cystoscopy      w/botox  . Tonsillectomy    . Breast lumpectomy      left breast  . Orif left hip  2009  . Eye surgery      cataracts   Family History  Problem Relation Age of Onset  . Heart disease Mother 56  . Breast cancer Mother    History   Social History  . Marital Status: Widowed    Spouse Name: N/A    Number  of Children: N/A  . Years of Education: N/A   Occupational History  . 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 Smoking status: Never Smoker   . Smokeless tobacco: Not on file  . Alcohol Use: No  . Drug Use: No  . Sexual Activity: Yes    Birth Control/ Protection: Post-menopausal   Other Topics Concern  . Not on file   Social History Narrative   ** Merged History Encounter **       Has supportive daughter   Lives with granddaughter - widowed 2007, after 43 years of marriage             Current Outpatient Prescriptions on File Prior to Visit  Medication Sig Dispense Refill  . acetaminophen (TYLENOL) 500 MG tablet Take 500 mg by mouth every 6 (six) hours as needed. For pain.      Marland Kitchen azithromycin (ZITHROMAX) 100 MG/5ML suspension Take 25 mLs (500 mg total) by mouth daily.  75 mL  0  . bisacodyl (DULCOLAX) 5 MG EC tablet Take 5 mg by mouth daily as needed. For constipation.      Marland Kitchen  furosemide (LASIX) 20 MG tablet TAKE 1 TABLET (20 MG TOTAL) BY MOUTH 2 (TWO) TIMES DAILY.  60 tablet  5  . furosemide (LASIX) 20 MG tablet TAKE 1 TABLET (20 MG TOTAL) BY MOUTH 2 (TWO) TIMES DAILY.  60 tablet  5  . megestrol (MEGACE) 40 MG/ML suspension TAKE 10 MLS (400 MG TOTAL) BY MOUTH DAILY.  480 mL  5  . metoprolol tartrate (LOPRESSOR) 25 MG tablet TAKE 1 TABLET TWICE E A DAY AS DIRECTED  60 tablet  5  . metoprolol tartrate (LOPRESSOR) 25 MG tablet TAKE 1 TABLET TWICE E A DAY AS DIRECTED  60 tablet  5  . moxifloxacin (AVELOX) 400 MG tablet Take 1 tablet (400 mg total) by mouth daily.  7 tablet  0  . NAMENDA 10 MG tablet TAKE 1 TABLET (10 MG TOTAL) BY MOUTH 2 (TWO) TIMES DAILY.  60 tablet  5  . Phenyleph-Promethazine-Cod 5-6.25-10 MG/5ML SYRP Take 5 mLs by mouth at bedtime as needed. May advance to 10 cc if needed  120 mL  2  . polyvinyl alcohol (LIQUIFILM TEARS) 1.4 % ophthalmic solution Place 1 drop into both eyes daily.       No current facility-administered medications on  file prior to visit.      Review of Systems System review is negative for any constitutional, cardiac, pulmonary, GI or neuro symptoms or complaints other than as described in the HPI.     Objective:   Physical Exam Vitals - not hot to touch, good skin turgor, BP 130/80 Gen'l - very old appearing woman sitting in a w/c in no distress. Does not follow commands. HEENT- C&S clear Cor - 1+ radial pulse, heart sounds are distant but seem regular Pulm - no labored breathing. Difficult exam - feint bibasilar rales noted. Abd - BS+, soft Neuro - severe psycho-motor retardation but will say a few words.       Assessment & Plan:  Fever -  No clear source of fever but favor intermittent aspiration as most likely cause.  PLan No indication for antibiotics  Aspirations precautions.

## 2013-07-23 NOTE — Assessment & Plan Note (Signed)
Hospice patient. Plan is to continue present treatments. No return to hospital. [name] daughter reports that [name] is for respite care at [facility] starting Friday, Sept 5th. For 3 days.

## 2013-07-23 NOTE — Assessment & Plan Note (Signed)
BP today is OK. At times she has had low readings.  Plan Continue present medications

## 2013-07-23 NOTE — Assessment & Plan Note (Signed)
Continued decline.

## 2013-07-23 NOTE — Assessment & Plan Note (Signed)
Probable cause of intermittent fevers. No tube feeding - so will need to follow aspiration precautions.

## 2013-07-23 NOTE — Assessment & Plan Note (Signed)
No acute signs of decompensation but definitely impaired by her cardiomyopathy.  Plan Continue present medications

## 2013-07-31 ENCOUNTER — Telehealth: Payer: Self-pay | Admitting: *Deleted

## 2013-07-31 NOTE — Telephone Encounter (Signed)
Spoke with family member advised of MDs message.

## 2013-07-31 NOTE — Telephone Encounter (Signed)
I saw [name] Saturday. She needs miraplex applied to wound every 5 days. Duoderm or tegraderm are alternatives by [facility] prefers the miraplex dressing.

## 2013-07-31 NOTE — Telephone Encounter (Signed)
[name] called requesting a return call from Dr Debby Bud.  States the instructions relayed to her from the family this weekend are unclear.  she is requesting clarification on care of pt pressure sore.  Please advise

## 2013-08-10 ENCOUNTER — Telehealth: Payer: Self-pay

## 2013-08-10 NOTE — Telephone Encounter (Signed)
Phone call from Thereasa Solo with [facility] [phone] stating patient's sacral wound is worse. Draining foul smell and starting to bleed. Patient's daughter and care giver think the wound was doing better when it was not cover all the time. Would like verbal to leave dressing off during the day while laying down and to use endit ointment. Please advise thanks

## 2013-08-10 NOTE — Telephone Encounter (Signed)
ok 

## 2013-08-11 ENCOUNTER — Other Ambulatory Visit: Payer: Self-pay | Admitting: Internal Medicine

## 2013-08-11 NOTE — Telephone Encounter (Signed)
Aggie Cosier notified okay for order described below.

## 2013-09-04 ENCOUNTER — Other Ambulatory Visit: Payer: Self-pay | Admitting: Internal Medicine

## 2013-09-07 ENCOUNTER — Telehealth: Payer: Self-pay

## 2013-09-07 NOTE — Telephone Encounter (Signed)
Phone call from [facility] and [facility] [phone] states patient will be going for a 3 night respite at [facility]

## 2013-09-20 ENCOUNTER — Telehealth: Payer: Self-pay | Admitting: Internal Medicine

## 2013-09-20 NOTE — Telephone Encounter (Signed)
Do not see on patient's med list. Please advise.

## 2013-09-20 NOTE — Telephone Encounter (Signed)
Spoke to [place] and let her know this is okay. Endit is an ointment for wound care to her sacral

## 2013-09-20 NOTE — Telephone Encounter (Signed)
OK   (what is it??)

## 2013-09-20 NOTE — Telephone Encounter (Signed)
Rosey Bath, nurse from hospice called request refill for Endit (come in a container). The nurse stated that Dr. Debby Bud gave this to pt before. Please call Rosey Bath with any question. Pt request for this med to [facility].

## 2013-10-05 ENCOUNTER — Telehealth: Payer: Self-pay | Admitting: *Deleted

## 2013-10-05 NOTE — Telephone Encounter (Signed)
Rosey Bath called states pt will be in Respite from Hospice from 11.14-11.17.

## 2013-10-13 ENCOUNTER — Other Ambulatory Visit: Payer: Self-pay | Admitting: Internal Medicine

## 2013-10-18 ENCOUNTER — Other Ambulatory Visit: Payer: Self-pay | Admitting: Internal Medicine

## 2013-11-16 ENCOUNTER — Other Ambulatory Visit: Payer: Self-pay | Admitting: Internal Medicine

## 2013-11-17 NOTE — Telephone Encounter (Signed)
Pls advise on refill since md is out of office...[name]

## 2013-12-16 ENCOUNTER — Other Ambulatory Visit: Payer: Self-pay | Admitting: Internal Medicine

## 2014-01-02 ENCOUNTER — Telehealth: Payer: Self-pay | Admitting: *Deleted

## 2014-01-02 NOTE — Telephone Encounter (Signed)
Hall Busing, RN with [facility] phoned to update status of patient for PCP   CVA sxs: left facial/mouth drooping; inability to speak, intermittent responsiveness; intermittent optic tracking; unable to assess orientation, alert    Aspiration sxs: chokes on mouth swabs (biotene)   No PO meds are being given at this time; fluids or food--NPO being used   Comfort measures have been implemented-atropine gtts are being picked up to be available prn, but pt/family refused morphine/ativan, at this time, but pharmacy will have available if/when needed.  Please advise if any further orders/actions needed. Granddaughter's contact info Warren Lacy [phone].Marland Kitchen Family requests that granddaughter be phoned and not patient's daughter.  Granddaughter is primary caregiver.  Hall Busing RN CB# [phone]

## 2014-01-05 ENCOUNTER — Telehealth: Payer: Self-pay | Admitting: Internal Medicine

## 2014-01-05 NOTE — Telephone Encounter (Signed)
FYI: [facility] called to let us know that [name] has passed away.  Medical Records is aware.

## 2014-01-21 DEATH — deceased

## 2014-09-24 ENCOUNTER — Encounter: Payer: Self-pay | Admitting: Internal Medicine
